# Patient Record
Sex: Male | Born: 1950 | Race: White | Hispanic: No | Marital: Married | Smoking: Never smoker
Health system: Southern US, Community
[De-identification: ages and names within clinical notes are randomized; demographics above are authoritative.]

## PROBLEM LIST (undated history)

## (undated) DIAGNOSIS — K625 Hemorrhage of anus and rectum: Secondary | ICD-10-CM

## (undated) DIAGNOSIS — K648 Other hemorrhoids: Secondary | ICD-10-CM

## (undated) DIAGNOSIS — M1990 Unspecified osteoarthritis, unspecified site: Secondary | ICD-10-CM

## (undated) DIAGNOSIS — T4145XA Adverse effect of unspecified anesthetic, initial encounter: Secondary | ICD-10-CM

## (undated) DIAGNOSIS — R011 Cardiac murmur, unspecified: Secondary | ICD-10-CM

## (undated) DIAGNOSIS — K5900 Constipation, unspecified: Secondary | ICD-10-CM

## (undated) DIAGNOSIS — I10 Essential (primary) hypertension: Secondary | ICD-10-CM

## (undated) DIAGNOSIS — T7840XA Allergy, unspecified, initial encounter: Secondary | ICD-10-CM

## (undated) HISTORY — PX: REFRACTIVE SURGERY: SHX103

## (undated) HISTORY — DX: Other hemorrhoids: K64.8

## (undated) HISTORY — PX: POLYPECTOMY: SHX149

## (undated) HISTORY — PX: UPPER GASTROINTESTINAL ENDOSCOPY: SHX188

## (undated) HISTORY — PX: SHOULDER ARTHROSCOPY W/ ROTATOR CUFF REPAIR: SHX2400

## (undated) HISTORY — PX: SIGMOIDOSCOPY: SUR1295

## (undated) HISTORY — DX: Cardiac murmur, unspecified: R01.1

## (undated) HISTORY — PX: COLONOSCOPY: SHX174

## (undated) HISTORY — PX: COLONOSCOPY W/ POLYPECTOMY: SHX1380

## (undated) HISTORY — DX: Hemorrhage of anus and rectum: K62.5

## (undated) HISTORY — DX: Allergy, unspecified, initial encounter: T78.40XA

## (undated) HISTORY — DX: Constipation, unspecified: K59.00

---

## 1994-05-26 DIAGNOSIS — T8859XA Other complications of anesthesia, initial encounter: Secondary | ICD-10-CM

## 1994-05-26 HISTORY — DX: Other complications of anesthesia, initial encounter: T88.59XA

## 1998-06-18 ENCOUNTER — Emergency Department (HOSPITAL_COMMUNITY): Admission: EM | Admit: 1998-06-18 | Discharge: 1998-06-18 | Payer: Self-pay | Admitting: Emergency Medicine

## 1999-02-27 ENCOUNTER — Encounter: Payer: Self-pay | Admitting: Internal Medicine

## 1999-02-27 ENCOUNTER — Encounter (INDEPENDENT_AMBULATORY_CARE_PROVIDER_SITE_OTHER): Payer: Self-pay | Admitting: *Deleted

## 1999-02-27 ENCOUNTER — Other Ambulatory Visit: Admission: RE | Admit: 1999-02-27 | Discharge: 1999-02-27 | Payer: Self-pay | Admitting: Internal Medicine

## 1999-03-04 ENCOUNTER — Encounter (INDEPENDENT_AMBULATORY_CARE_PROVIDER_SITE_OTHER): Payer: Self-pay | Admitting: *Deleted

## 1999-03-04 ENCOUNTER — Encounter: Payer: Self-pay | Admitting: Internal Medicine

## 1999-03-04 ENCOUNTER — Ambulatory Visit (HOSPITAL_COMMUNITY): Admission: RE | Admit: 1999-03-04 | Discharge: 1999-03-04 | Payer: Self-pay | Admitting: Internal Medicine

## 1999-05-27 HISTORY — PX: EYE SURGERY: SHX253

## 2002-06-15 ENCOUNTER — Encounter: Payer: Self-pay | Admitting: Internal Medicine

## 2006-07-29 ENCOUNTER — Ambulatory Visit: Payer: Self-pay | Admitting: Internal Medicine

## 2006-08-12 ENCOUNTER — Ambulatory Visit: Payer: Self-pay | Admitting: Internal Medicine

## 2010-01-24 ENCOUNTER — Ambulatory Visit (HOSPITAL_BASED_OUTPATIENT_CLINIC_OR_DEPARTMENT_OTHER): Admission: RE | Admit: 2010-01-24 | Discharge: 2010-01-24 | Payer: Self-pay | Admitting: Orthopedic Surgery

## 2010-02-26 ENCOUNTER — Telehealth: Payer: Self-pay | Admitting: Internal Medicine

## 2010-03-05 ENCOUNTER — Ambulatory Visit: Payer: Self-pay | Admitting: Gastroenterology

## 2010-03-05 DIAGNOSIS — K648 Other hemorrhoids: Secondary | ICD-10-CM | POA: Insufficient documentation

## 2010-03-05 DIAGNOSIS — K5900 Constipation, unspecified: Secondary | ICD-10-CM | POA: Insufficient documentation

## 2010-03-05 DIAGNOSIS — K625 Hemorrhage of anus and rectum: Secondary | ICD-10-CM

## 2010-03-05 HISTORY — DX: Other hemorrhoids: K64.8

## 2010-03-05 HISTORY — DX: Constipation, unspecified: K59.00

## 2010-03-05 HISTORY — DX: Hemorrhage of anus and rectum: K62.5

## 2010-06-25 NOTE — Assessment & Plan Note (Signed)
Summary: rectal bleeding/pl                    perry pt   History of Present Illness Visit Type: Initial Visit Primary GI MD: Yancey Flemings MD Primary Kecia Swoboda: Bradd Canary, MD Requesting Mishayla Sliwinski: Bradd Canary, MD Chief Complaint: Upper intermittant dull pains ongoing for years. Pt states starting 3-4 weeks ago pt noticed some BRB in stool. Pt states he has hemorrhoids that will bleed sometimes but usually bleeding subsides after a few days. The bleeding has been ongoing for several weeks. History of Present Illness:   Patient is a 60 year old male seen last in 2008 for colonoscopy for polyps surveillance. He is here for evaluation of painless rectal bleeding with bowel movements. He has had occasional rectal bleeding but lately the bleeding  has been much more frequent.. Blood is bright red, drips in the toilet. Patient does recall having hard stools a few weeks ago while taking pain medication after shoulder surgery. Bowel movements now back to normal.No abdominal pain other than occasional gas pain.  Concerned because father had colon cancer in his 57's. Patient is up to date on screenings.    GI Review of Systems      Denies abdominal pain, acid reflux, belching, bloating, chest pain, dysphagia with liquids, dysphagia with solids, heartburn, loss of appetite, nausea, vomiting, vomiting blood, weight loss, and  weight gain.      Reports constipation and  rectal bleeding.     Denies anal fissure, black tarry stools, change in bowel habit, diarrhea, diverticulosis, fecal incontinence, heme positive stool, hemorrhoids, irritable bowel syndrome, jaundice, light color stool, liver problems, and  rectal pain. Preventive Screening-Counseling & Management  Alcohol-Tobacco     Smoking Status: never      Drug Use:  no.      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  Alcohol Use - yes Daily Caffeine Use Illicit Drug Use - no Smoking Status:  never Drug Use:  no  Vital Signs:  Patient profile:   61 year old male Height:      70 inches Weight:      176.38 pounds BMI:     25.40 Pulse rate:   88 / minute Pulse rhythm:   regular BP sitting:   146 / 88  (right arm) Cuff size:   regular  Vitals Entered By: Christie Nottingham CMA Duncan Dull) (March 05, 2010 8:30 AM)  Physical Exam  General:  Well developed, well nourished, no acute distress. Head:  Normocephalic and atraumatic. Eyes:  Conjunctiva pink, no icterus.  Mouth:  No oral lesions. Tongue moist.  Neck:  no obvious masses  Lungs:  Clear throughout to auscultation. Heart:  Regular rate and rhythm; no murmurs, rubs,  or bruits. Abdomen:  Abdomen soft, nontender, nondistended. No obvious masses or hepatomegaly.Normal bowel sounds.  Rectal:  One mildly inflamed, bluish perianal hemorrhoid. On anoscopy there were large, inflamed internal hemorrhoids Msk:  Symmetrical with no gross deformities. Normal posture. Extremities:  No palmar erythema, no edema.  Neurologic:  Alert and  oriented x4;  grossly normal neurologically. Skin:  Intact without significant lesions  or rashes. Cervical Nodes:  No significant cervical adenopathy. Psych:  Alert and cooperative. Normal mood and affect.    Impression & Recommendations:  Problem # 1:  RECTAL BLEEDING (ICD-569.3) Assessment Deteriorated Likely related to the inflamed internal hemorrhoids seen on anoscopy. Will treat with steroid suppositories. Bowel movements back to baseline but start Miralax for recurrent  constipation. Last full colonoscopy was March 2008 and normal except for internal hemorrhoids.  Will have patient return to see Dr. Marina Goodell in 3-4 weeks for reevaluation. In the interim, patient will call if bleeding doesn't subside.   Problem # 2:  HEMORRHOIDS-INTERNAL (ICD-455.0) Assessment: Deteriorated See #1.  Problem # 3:  FAMILY HX COLON CANCER (ICD-V16.0) Assessment: Comment Only Up to date on screenings, last one March 2008.   Patient Instructions: 1)  We sent prescription for suppositories to [facility]. 2)  Try to keep your stools soft. 3)  You may use a stool softner such as Colace if you need to. 4)  We suggest taking Miralax powder 17 grams in a glass of wataer or clear juiice daily. 5)  High Fiber Diet brochure given. 6)  Appointment made with Dr Marina Goodell for 04-03-10. 7)  Appointment card given. 8)  Copy sent to : Bradd Canary, MD 9)  The medication list was reviewed and reconciled.  All changed / newly prescribed medications were explained.  A complete medication list was provided to the patient / caregiver. Prescriptions: HYDROCORTISONE ACETATE 25 MG SUPP (HYDROCORTISONE ACETATE) Use 1 suppository twice daily for 3 days then at bedtime x 10 days  #15 x 1   Entered by:   Lowry Ram NCMA   Authorized by:   Willette Cluster NP   Signed by:   Lowry Ram NCMA on 03/05/2010   Method used:   Electronically to        [facility] [phone]* (retail)       [address]       [place], [place]  [address]       Ph: [phone] or [phone]       Fax: [phone]   RxID:   [id number]

## 2010-06-25 NOTE — Procedures (Signed)
Summary: colon   Colonoscopy  Procedure date:  06/15/2002  Findings:      Location:  [facility].   Patient Name: Matthew Fleming, Matthew Fleming. MRN:  Procedure Procedures: Colonoscopy CPT: [phone].  Personnel: Endoscopist: Wilhemina Bonito. Marina Goodell, MD.  Exam Location: Exam performed in Outpatient Clinic. Outpatient  Patient Consent: Procedure, Alternatives, Risks and Benefits discussed, consent obtained, from patient. Consent was obtained by the RN.  Indications  Surveillance of: Adenomatous Polyp(s). This is an initial surveillance exam. Initial polypectomy was performed in 2000. in Oct. 1-2 Polyps were found at Index Exam. Largest polyp removed was 1 to 5 mm. Prior polyp located in proximal (splenic flexure and beyond) colon. Pathology of worst  polyp: tubular adenoma.  Increased Risk Screening: For family history of colorectal neoplasia, in  parent age at onset: 67.  History  Pre-Exam Physical: Performed Jun 15, 2002. Entire physical exam was normal.  Exam Exam: Extent of exam reached: Cecum, extent intended: Cecum.  The cecum was identified by appendiceal orifice and IC valve. Patient position: on left side. Colon retroflexion performed. Images taken. ASA Classification: I. Tolerance: excellent.  Monitoring: Pulse and BP monitoring, Oximetry used. Supplemental O2 given.  Colon Prep Used Visicol for colon prep. Prep results: good.  Sedation Meds: Patient assessed and found to be appropriate for moderate (conscious) sedation. Fentanyl 100 mcg. given IV. Versed 10 mg. given IV.  Comments: Some adherent stool cecum/ascending colon resistant to vigorous irrigation Findings NORMAL EXAM: Cecum to Rectum. Comments: internal hemorrhoids present.  DIVERTICULOSIS: Sigmoid Colon. ICD9: Diverticulosis, Colon: 562.10. Comments: mild changes.   Assessment Abnormal examination, see findings above.  Diagnoses: 562.10: Diverticulosis, Colon.  455.0: Hemorrhoids, Internal.    Events  Unplanned Interventions: No intervention was required.  Unplanned Events: There were no complications. Plans Disposition: After procedure patient sent to recovery. After recovery patient sent home.  Scheduling/Referral: Colonoscopy, to Wilhemina Bonito. Marina Goodell, MD, in 3 years,    This report was created from the original endoscopy report, which was reviewed and signed by the above listed endoscopist.

## 2010-06-25 NOTE — Procedures (Signed)
Summary: colon   Colonoscopy  Procedure date:  08/12/2006  Findings:      Location:  [facility].   Patient Name: Matthew Fleming, Matthew Fleming. MRN:  Procedure Procedures: Colonoscopy CPT: [phone].  Personnel: Endoscopist: Wilhemina Bonito. Marina Goodell, MD.  Exam Location: Exam performed in Outpatient Clinic. Outpatient  Patient Consent: Procedure, Alternatives, Risks and Benefits discussed, consent obtained, from patient. Consent was obtained by the RN.  Indications  Surveillance of: Adenomatous Polyp(s). This is not an initial surveillance exam. Initial polypectomy was performed in 2000. Pathology of worst  polyp: tubular adenoma. Previous surveillance exam(s) in  2004,  Increased Risk Screening: For family history of colorectal neoplasia, in  parent age at onset: 57.  History  Current Medications: Patient is not currently taking Coumadin.  Pre-Exam Physical: Performed Aug 12, 2006. Cardio-pulmonary exam, Rectal exam, HEENT exam , Abdominal exam, Mental status exam WNL.  Comments: Pt. history reviewed/updated, physical exam performed prior to initiation of sedation?yes Exam Exam: Extent of exam reached: Cecum, extent intended: Cecum.  The cecum was identified by appendiceal orifice and IC valve. Patient position: on left side. Time to Cecum: 00:05:05. Time for Withdrawl: 00:12:44. Colon retroflexion performed. Images taken. ASA Classification: I. Tolerance: excellent.  Monitoring: Pulse and BP monitoring, Oximetry used. Supplemental O2 given.  Colon Prep Used Miralax for colon prep. Prep results: excellent.  Sedation Meds: Patient assessed and found to be appropriate for moderate (conscious) sedation. Fentanyl 100 mcg. given IV. Versed 10 mg. given IV.  Findings NORMAL EXAM: Cecum to Rectum.   Assessment  Diagnoses: 455.0: Hemorrhoids, Internal.   Comments: NO POLYPS SEEN Events  Unplanned Interventions: No intervention was required.  Unplanned Events: There were  no complications. Plans Disposition: After procedure patient sent to recovery. After recovery patient sent home.  Scheduling/Referral: Colonoscopy, to Wilhemina Bonito. Marina Goodell, MD, in 5 years,    This report was created from the original endoscopy report, which was reviewed and signed by the above listed endoscopist.

## 2010-06-25 NOTE — Procedures (Signed)
Summary: EGD/[facility]  EGD/[facility]   Imported By: Sherian Rein 03/07/2010 09:19:56  _____________________________________________________________________  External Attachment:    Type:   Image     Comment:   External Document

## 2010-06-25 NOTE — Progress Notes (Signed)
Summary: Triage / rectal bleeding  Phone Note Call from Patient Call back at [phone]   Caller: Patient Call For: Dr. Marina Goodell Reason for Call: Talk to Nurse Summary of Call: Rectal bleeding x3 wks Initial call taken by: Karna Christmas,  February 26, 2010 9:58 AM  Follow-up for Phone Call        Spoke with pt. He has been having bright red blood with his BM's  on and off for the last 2- 3 weeks.  No clots, no nausea or vomiting, no fever, but has a dull aching pain above his naval. He states the bleeding does stop past his BM. He states he has had hemorrhoids in the past also. He has not been seen in the office for several years and had his last colonoscopy 08/12/2006. He is out of town this week. I requested chart to Dr Marina Goodell ASAP for review Please advise.Ulis Rias RN  February 26, 2010 10:21 AM     Additional Follow-up for Phone Call Additional follow up Details #1::        work him in with an extender for evaluation when he returns to town. For more serious bleeding or problems with pain in the interim, he should go to the emergency room Additional Follow-up by: Hilarie Fredrickson MD,  February 26, 2010 10:56 AM    Additional Follow-up for Phone Call Additional follow up Details #2::    Called pt and left message to schedule an appt with one of our extenders next week when he is back in town or to go to the ER if bleeding or pain gets worse .Ulis Rias RN  February 26, 2010 11:07 AM  pt returned call and is scheduled for an appt with Gunnar Fusi on 03/05/10.  Chart ordered   Follow-up by: Chales Abrahams CMA Duncan Dull),  February 27, 2010 9:26 AM

## 2010-08-08 LAB — POCT HEMOGLOBIN-HEMACUE: Hemoglobin: 17.3 g/dL — ABNORMAL HIGH (ref 13.0–17.0)

## 2010-11-07 ENCOUNTER — Other Ambulatory Visit: Payer: Self-pay | Admitting: Dermatology

## 2011-06-27 ENCOUNTER — Encounter: Payer: Self-pay | Admitting: Internal Medicine

## 2012-03-25 ENCOUNTER — Encounter: Payer: Self-pay | Admitting: Internal Medicine

## 2012-03-31 ENCOUNTER — Telehealth: Payer: Self-pay | Admitting: Internal Medicine

## 2012-03-31 NOTE — Telephone Encounter (Signed)
Left message for patient to call back  

## 2012-04-02 NOTE — Telephone Encounter (Signed)
Left message for patient to call back  

## 2012-04-07 NOTE — Telephone Encounter (Signed)
Pt called back and wanted to schedule his recall colon. Appts scheduled and pt aware.

## 2012-05-04 ENCOUNTER — Encounter: Payer: Self-pay | Admitting: Internal Medicine

## 2012-05-04 ENCOUNTER — Ambulatory Visit (AMBULATORY_SURGERY_CENTER): Payer: BC Managed Care – HMO | Admitting: *Deleted

## 2012-05-04 VITALS — Ht 70.0 in | Wt 173.0 lb

## 2012-05-04 DIAGNOSIS — Z1211 Encounter for screening for malignant neoplasm of colon: Secondary | ICD-10-CM

## 2012-05-11 ENCOUNTER — Encounter: Payer: BC Managed Care – HMO | Admitting: Internal Medicine

## 2012-06-25 ENCOUNTER — Telehealth: Payer: Self-pay | Admitting: Internal Medicine

## 2012-06-25 NOTE — Telephone Encounter (Signed)
Colonoscopy from 12/19 was cancelled by pt.  Rescheduled for 3/5.  Pt scheduled for PV for 2/19.  Pt aware of appointment.

## 2012-07-14 ENCOUNTER — Ambulatory Visit (AMBULATORY_SURGERY_CENTER): Payer: BC Managed Care – PPO | Admitting: *Deleted

## 2012-07-14 ENCOUNTER — Encounter: Payer: Self-pay | Admitting: Internal Medicine

## 2012-07-14 VITALS — Ht 70.0 in | Wt 174.4 lb

## 2012-07-14 MED ORDER — MOVIPREP 100 G PO SOLR: ORAL | Status: DC

## 2012-07-28 ENCOUNTER — Ambulatory Visit (AMBULATORY_SURGERY_CENTER): Payer: BC Managed Care – PPO | Admitting: Internal Medicine

## 2012-07-28 ENCOUNTER — Encounter: Payer: Self-pay | Admitting: Internal Medicine

## 2012-07-28 VITALS — BP 112/75 | HR 45 | Temp 97.0°F | Resp 10 | Ht 70.0 in | Wt 174.0 lb

## 2012-07-28 DIAGNOSIS — Z800 Family history of malignant neoplasm of digestive organs: Secondary | ICD-10-CM

## 2012-07-28 DIAGNOSIS — Z860100 Personal history of colon polyps, unspecified: Secondary | ICD-10-CM

## 2012-07-28 DIAGNOSIS — D126 Benign neoplasm of colon, unspecified: Secondary | ICD-10-CM

## 2012-07-28 MED ORDER — SODIUM CHLORIDE 0.9 % IV SOLN: 500.0000 mL | INTRAVENOUS | Status: DC

## 2012-07-28 NOTE — Progress Notes (Signed)
YOU HAD AN ENDOSCOPIC PROCEDURE TODAY AT THE [facility]: Refer to the procedure report that was given to you for any specific questions about what was found during the examination.  If the procedure report does not answer your questions, please call your gastroenterologist to clarify.  If you requested that your care partner not be given the details of your procedure findings, then the procedure report has been included in a sealed envelope for you to review at your convenience later.  YOU SHOULD EXPECT: Some feelings of bloating in the abdomen. Passage of more gas than usual.  Walking can help get rid of the air that was put into your GI tract during the procedure and reduce the bloating. If you had a lower endoscopy (such as a colonoscopy or flexible sigmoidoscopy) you may notice spotting of blood in your stool or on the toilet paper. If you underwent a bowel prep for your procedure, then you may not have a normal bowel movement for a few days.  DIET: Your first meal following the procedure should be a light meal and then it is ok to progress to your normal diet.  A half-sandwich or bowl of soup is an example of a good first meal.  Heavy or fried foods are harder to digest and may make you feel nauseous or bloated.  Likewise meals heavy in dairy and vegetables can cause extra gas to form and this can also increase the bloating.  Drink plenty of fluids but you should avoid alcoholic beverages for 24 hours.  ACTIVITY: Your care partner should take you home directly after the procedure.  You should plan to take it easy, moving slowly for the rest of the day.  You can resume normal activity the day after the procedure however you should NOT DRIVE or use heavy machinery for 24 hours (because of the sedation medicines used during the test).    SYMPTOMS TO REPORT IMMEDIATELY: A gastroenterologist can be reached at any hour.  During normal business hours, 8:30 AM to 5:00 PM Monday through Friday,  call [phone].  After hours and on weekends, please call the GI answering service at [phone] who will take a message and have the physician on call contact you.   Following lower endoscopy (colonoscopy or flexible sigmoidoscopy):  Excessive amounts of blood in the stool  Significant tenderness or worsening of abdominal pains  Swelling of the abdomen that is new, acute  Fever of 100F or higher  FOLLOW UP: If any biopsies were taken you will be contacted by phone or by letter within the next 1-3 weeks.  Call your gastroenterologist if you have not heard about the biopsies in 3 weeks.  Our staff will call the home number listed on your records the next business day following your procedure to check on you and address any questions or concerns that you may have at that time regarding the information given to you following your procedure. This is a courtesy call and so if there is no answer at the home number and we have not heard from you through the emergency physician on call, we will assume that you have returned to your regular daily activities without incident.  SIGNATURES/CONFIDENTIALITY: You and/or your care partner have signed paperwork which will be entered into your electronic medical record.  These signatures attest to the fact that that the information above on your After Visit Summary has been reviewed and is understood.  Full responsibility of the confidentiality of this   discharge information lies with you and/or your care-partner.    Resume medications. Information given on polyps with discharge instructions. 

## 2012-07-28 NOTE — Patient Instructions (Addendum)
YOU HAD AN ENDOSCOPIC PROCEDURE TODAY AT THE [facility]: Refer to the procedure report that was given to you for any specific questions about what was found during the examination.  If the procedure report does not answer your questions, please call your gastroenterologist to clarify.  If you requested that your care partner not be given the details of your procedure findings, then the procedure report has been included in a sealed envelope for you to review at your convenience later.  YOU SHOULD EXPECT: Some feelings of bloating in the abdomen. Passage of more gas than usual.  Walking can help get rid of the air that was put into your GI tract during the procedure and reduce the bloating. If you had a lower endoscopy (such as a colonoscopy or flexible sigmoidoscopy) you may notice spotting of blood in your stool or on the toilet paper. If you underwent a bowel prep for your procedure, then you may not have a normal bowel movement for a few days.  DIET: Your first meal following the procedure should be a light meal and then it is ok to progress to your normal diet.  A half-sandwich or bowl of soup is an example of a good first meal.  Heavy or fried foods are harder to digest and may make you feel nauseous or bloated.  Likewise meals heavy in dairy and vegetables can cause extra gas to form and this can also increase the bloating.  Drink plenty of fluids but you should avoid alcoholic beverages for 24 hours.  ACTIVITY: Your care partner should take you home directly after the procedure.  You should plan to take it easy, moving slowly for the rest of the day.  You can resume normal activity the day after the procedure however you should NOT DRIVE or use heavy machinery for 24 hours (because of the sedation medicines used during the test).    SYMPTOMS TO REPORT IMMEDIATELY: A gastroenterologist can be reached at any hour.  During normal business hours, 8:30 AM to 5:00 PM Monday through Friday,  call [phone].  After hours and on weekends, please call the GI answering service at [phone] who will take a message and have the physician on call contact you.   Following lower endoscopy (colonoscopy or flexible sigmoidoscopy):  Excessive amounts of blood in the stool  Significant tenderness or worsening of abdominal pains  Swelling of the abdomen that is new, acute  Fever of 100F or higher  FOLLOW UP: If any biopsies were taken you will be contacted by phone or by letter within the next 1-3 weeks.  Call your gastroenterologist if you have not heard about the biopsies in 3 weeks.  Our staff will call the home number listed on your records the next business day following your procedure to check on you and address any questions or concerns that you may have at that time regarding the information given to you following your procedure. This is a courtesy call and so if there is no answer at the home number and we have not heard from you through the emergency physician on call, we will assume that you have returned to your regular daily activities without incident.  SIGNATURES/CONFIDENTIALITY: You and/or your care partner have signed paperwork which will be entered into your electronic medical record.  These signatures attest to the fact that that the information above on your After Visit Summary has been reviewed and is understood.  Full responsibility of the confidentiality of this   discharge information lies with you and/or your care-partner.    Resume medications. Information given on polyps with discharge instructions. 

## 2012-07-28 NOTE — Progress Notes (Signed)
Patient did not experience any of the following events: a burn prior to discharge; a fall within the facility; wrong site/side/patient/procedure/implant event; or a hospital transfer or hospital admission upon discharge from the facility. (G8907) Patient did not have preoperative order for IV antibiotic SSI prophylaxis. ([id number])  

## 2012-07-28 NOTE — Progress Notes (Signed)
Called to room to assist during endoscopic procedure.  Patient ID and intended procedure confirmed with present staff. Received instructions for my participation in the procedure from the performing physician. ewm 

## 2012-07-28 NOTE — Op Note (Signed)
[facility] [address] [place], [address]   COLONOSCOPY PROCEDURE REPORT  PATIENT: Matthew Fleming, Matthew Fleming.  MR#: [id number] BIRTHDATE: 04-17-1951 , 61  yrs. old GENDER: Male ENDOSCOPIST: Roxy Cedar, MD REFERRED HQ:[id number] Program Recall PROCEDURE DATE:  07/28/2012 PROCEDURE:   Colonoscopy with snare polypectomy    x 1 ASA CLASS:   Class I INDICATIONS:Patient's immediate family history of colon cancer (parent 69) and Patient's personal history of adenomatous colon polyps (2000, 04, 08) MEDICATIONS: MAC sedation, administered by CRNA and propofol (Diprivan) 300mg  IV  DESCRIPTION OF PROCEDURE:   After the risks benefits and alternatives of the procedure were thoroughly explained, informed consent was obtained.  A digital rectal exam revealed no abnormalities of the rectum.   The LB [id number]  endoscope was introduced through the anus and advanced to the cecum, which was identified by both the appendix and ileocecal valve. No adverse events experienced.   The quality of the prep was good, using MoviPrep  The instrument was then slowly withdrawn as the colon was fully examined.      COLON FINDINGS: A diminutive polyp was found at the cecum.  A polypectomy was performed with a cold snare.  The resection was complete and the polyp tissue was completely retrieved.   The colon mucosa was otherwise normal.  Retroflexed views revealed internal hemorrhoids. The time to cecum=3 minutes 59 seconds.  Withdrawal time=12 minutes 27 seconds.  The scope was withdrawn and the procedure completed. COMPLICATIONS: There were no complications.  ENDOSCOPIC IMPRESSION: 1.   Diminutive polyp was found at the cecum; polypectomy was performed with a cold snare 2.   The colon mucosa was otherwise normal  RECOMMENDATIONS: 1. Follow up colonoscopy in 5 years   eSigned:  Roxy Cedar, MD 07/28/2012 10:28 AM   cc: Bradd Canary, MD and The Patient   PATIENT  NAME:  Matthew Fleming. MR#: [id number]

## 2012-07-29 ENCOUNTER — Telehealth: Payer: Self-pay | Admitting: *Deleted

## 2012-07-29 NOTE — Telephone Encounter (Signed)
  Follow up Call-  Call back number 07/28/2012  Post procedure Call Back phone  # [phone]  Permission to leave phone message Yes     Patient questions:  Do you have a fever, pain , or abdominal swelling? no Pain Score  0 *  Have you tolerated food without any problems? yes  Have you been able to return to your normal activities? yes  Do you have any questions about your discharge instructions: Diet   no Medications  no Follow up visit  no  Do you have questions or concerns about your Care? no  Actions: * If pain score is 4 or above: No action needed, pain <4.

## 2012-08-02 ENCOUNTER — Encounter: Payer: Self-pay | Admitting: Internal Medicine

## 2014-07-17 ENCOUNTER — Encounter: Payer: Self-pay | Admitting: Podiatry

## 2014-07-17 ENCOUNTER — Ambulatory Visit (INDEPENDENT_AMBULATORY_CARE_PROVIDER_SITE_OTHER): Payer: BLUE CROSS/BLUE SHIELD | Admitting: Podiatry

## 2014-07-17 ENCOUNTER — Ambulatory Visit (INDEPENDENT_AMBULATORY_CARE_PROVIDER_SITE_OTHER): Payer: BLUE CROSS/BLUE SHIELD

## 2014-07-17 VITALS — BP 132/74 | HR 59 | Resp 16

## 2014-07-17 DIAGNOSIS — M79672 Pain in left foot: Secondary | ICD-10-CM

## 2014-07-17 DIAGNOSIS — M779 Enthesopathy, unspecified: Secondary | ICD-10-CM | POA: Diagnosis not present

## 2014-07-17 MED ORDER — TRIAMCINOLONE ACETONIDE 10 MG/ML IJ SUSP
10.0000 mg | Freq: Once | INTRAMUSCULAR | Status: AC
  Administered 2014-07-17: 10 mg

## 2014-07-17 NOTE — Progress Notes (Signed)
   Subjective:    Patient ID: Matthew Fleming, male    DOB: [birth year], 64 y.o.   MRN: [id number]  HPI Comments: "I have pain in the left foot"  Patient c/o aching dorsal midfoot for about 1 month. He has been treated here for fasciitis. Was made orthotics but could never wear. He's had multiple cortisone injections in the past. No injury.     Review of Systems  All other systems reviewed and are negative.      Objective:   Physical Exam        Assessment & Plan:

## 2014-07-17 NOTE — Progress Notes (Signed)
Subjective:     Patient ID: Matthew Fleming, male   DOB: 05-Nov-1950, 64 y.o.   MRN: [id number]  HPI patient presents with pain on top of his left foot also has structural bunion deformities and flatfoot deformity noted bilateral. States it's been present for several months worse over the last month   Review of Systems  All other systems reviewed and are negative.      Objective:   Physical Exam  Constitutional: He is oriented to person, place, and time.  Cardiovascular: Intact distal pulses.   Musculoskeletal: Normal range of motion.  Neurological: He is oriented to person, place, and time.  Skin: Skin is warm.  Nursing note and vitals reviewed.  neurovascular found to be intact with muscle strength adequate and range of motion subtalar and midtarsal joint within normal limits. 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 Today I did a careful dorsal injection of the midtarsal joint extensor complex 3 mg Kenalog 5 mg Xylocaine and scanned for custom orthotics to reduce the stress on his feet and help to elevate his arch. He will be seen back in the next 3 weeks or earlier if any issues should occur

## 2014-08-09 ENCOUNTER — Ambulatory Visit: Payer: BLUE CROSS/BLUE SHIELD | Admitting: *Deleted

## 2014-08-09 DIAGNOSIS — M779 Enthesopathy, unspecified: Secondary | ICD-10-CM

## 2014-08-09 NOTE — Patient Instructions (Signed)

## 2014-08-09 NOTE — Progress Notes (Signed)
Patient ID: Matthew Fleming, male   DOB: 09-21-1950, 64 y.o.   MRN: [id number] PICKING UP INSERTS

## 2014-08-11 LAB — BASIC METABOLIC PANEL
BUN: 14 (ref 4–21)
Creatinine: 1.1 (ref 0.6–1.3)
Glucose: 98
POTASSIUM: 4.4 (ref 3.4–5.3)
Sodium: 141 (ref 137–147)

## 2014-08-11 LAB — HEPATIC FUNCTION PANEL
ALT: 11 (ref 10–40)
AST: 14 (ref 14–40)

## 2014-08-11 LAB — LIPID PANEL
CHOLESTEROL: 178 (ref 0–200)
HDL: 66 (ref 35–70)
LDL Cholesterol: 100
Triglycerides: 61 (ref 40–160)

## 2014-08-11 LAB — CBC AND DIFFERENTIAL
HCT: 46 (ref 41–53)
Hemoglobin: 15.1 (ref 13.5–17.5)
WBC: 5.2

## 2014-08-11 LAB — TSH: TSH: 1.57 (ref 0.41–5.90)

## 2014-09-21 ENCOUNTER — Ambulatory Visit: Payer: BLUE CROSS/BLUE SHIELD | Admitting: Podiatry

## 2014-09-28 ENCOUNTER — Ambulatory Visit (INDEPENDENT_AMBULATORY_CARE_PROVIDER_SITE_OTHER): Payer: BLUE CROSS/BLUE SHIELD | Admitting: Podiatry

## 2014-09-28 ENCOUNTER — Encounter: Payer: Self-pay | Admitting: Podiatry

## 2014-09-28 VITALS — BP 132/60 | HR 62 | Resp 12

## 2014-09-28 DIAGNOSIS — M779 Enthesopathy, unspecified: Secondary | ICD-10-CM | POA: Diagnosis not present

## 2014-09-28 DIAGNOSIS — M2012 Hallux valgus (acquired), left foot: Secondary | ICD-10-CM | POA: Diagnosis not present

## 2014-09-28 DIAGNOSIS — M21612 Bunion of left foot: Secondary | ICD-10-CM

## 2014-09-28 NOTE — Progress Notes (Signed)
Subjective:     Patient ID: Matthew Fleming, male   DOB: 1950/12/06, 64 y.o.   MRN: [id number]  HPI patient is not happy about his orthotics and also has questions about his left bunion   Review of Systems     Objective:   Physical Exam Neurovascular status intact with orthotics which appear to be too hard for him which we will modify and structural hyperostosis medial aspect first metatarsal head left    Assessment:     Structural bunion deformity left with orthotics which need to be worked on    Plan:     Discussed correction of the bunion which we'll do the future and at this time we will send his orthotics in for revamping

## 2014-10-27 ENCOUNTER — Ambulatory Visit: Payer: BLUE CROSS/BLUE SHIELD | Admitting: *Deleted

## 2014-10-27 DIAGNOSIS — M779 Enthesopathy, unspecified: Secondary | ICD-10-CM

## 2014-10-27 NOTE — Progress Notes (Signed)
Patient ID: Matthew Fleming, male   DOB: [birth year], 64 y.o.   MRN: [id number] Picking up inserts

## 2014-10-27 NOTE — Patient Instructions (Signed)

## 2014-12-28 ENCOUNTER — Encounter: Payer: Self-pay | Admitting: Internal Medicine

## 2015-01-02 ENCOUNTER — Telehealth: Payer: Self-pay | Admitting: *Deleted

## 2015-01-02 NOTE — Telephone Encounter (Addendum)
PATIENT CAME INTO THE [facility] OFFICE AND STATED THAT HE WANTED THE AMOUNT TAKEN OFF HIS BILL FOR THE INSERTS AND THAT IS WHAT DR REGAL TOLD HIM AND I ASKED HIM IF HE WOULD LIKE TO MAKE AN APPOINTMENT WITH DR REGAL AND HE STATED NO AND THAT THE INSERTS HAVE NOT WORKED AND JUST WANTED THE AMOUNT TAKEN OFF. Matthew Fleming

## 2015-06-26 ENCOUNTER — Encounter: Payer: Self-pay | Admitting: Cardiovascular Disease

## 2015-06-26 ENCOUNTER — Telehealth: Payer: Self-pay

## 2015-06-26 ENCOUNTER — Ambulatory Visit (INDEPENDENT_AMBULATORY_CARE_PROVIDER_SITE_OTHER): Payer: BLUE CROSS/BLUE SHIELD | Admitting: Cardiovascular Disease

## 2015-06-26 VITALS — BP 150/82 | HR 56 | Ht 70.0 in | Wt 179.0 lb

## 2015-06-26 DIAGNOSIS — Z7189 Other specified counseling: Secondary | ICD-10-CM

## 2015-06-26 DIAGNOSIS — Z7689 Persons encountering health services in other specified circumstances: Secondary | ICD-10-CM

## 2015-06-26 NOTE — Patient Instructions (Addendum)
Medication Instructions:  Your physician recommends that you continue on your current medications as directed. Please refer to the Current Medication list given to you today.  Labwork: NONE  Testing/Procedures: Cardiac CT scanning, (CAT scanning), is a noninvasive, special x-Faison that produces cross-sectional images of the body using x-rays and a computer. CT scans help physicians diagnose and treat medical conditions. For some CT exams, a contrast material is used to enhance visibility in the area of the body being studied. CT scans provide greater clarity and reveal more details than regular x-[place] exams.  Follow-Up: Your physician wants you to follow-up as needed.   Call CT department at [phone] to schedule appointment.  Here is Dr. Jake Church number [phone] if needed.  If you need a refill on your cardiac medications before your next appointment, please call your pharmacy.

## 2015-06-26 NOTE — Progress Notes (Signed)
Patient ID: Matthew Fleming, male   DOB: [birth year], 65 y.o.   MRN: [id number]     Cardiology Office Note   Date:  06/26/2015   ID:  Brittney, Matthew Fleming Nov 01, 1950, MRN [id number]  PCP:  Pauline Good, MD  Cardiologist:   Jenkins Rouge, MD   Chief Complaint  Patient presents with  . Establish Care    no sx      History of Present Illness: Matthew Fleming is a 65 y.o. male who presents for heart evaluation. Self referred Wanted ECG.  CRF;s only family history Had a physical for life insurance in the Fall and ECG read as abnormal with "biphasic T wave" I reviewed this ECG and felt it was normal.  Activity limited by left hip pain worse since March. Had accident on horse in 20's and has had "arthritis" in hip.  Acitve less running since hip acted up but uses bike, elliptical and weights 4x/week with no chest pain , palpitations dyspnea or syncope.  No other older ECGls in Matthew Fleming.  No history of CAD, vascular disease.  Non smoker  Currently retired from [employer]    Past Medical History  Diagnosis Date  . HEMORRHOIDS-INTERNAL 03/05/2010    Qualifier: Diagnosis of  By: Chester Holstein NP, Nevin Bloodgood    . CONSTIPATION 03/05/2010    Qualifier: Diagnosis of  By: Chester Holstein NP, Nevin Bloodgood    . RECTAL BLEEDING 03/05/2010    Qualifier: Diagnosis of  By: Chester Holstein NP, Nevin Bloodgood      Past Surgical History  Procedure Laterality Date  . Rotator cuff repair  1996 , 2012    bilateral     No current outpatient prescriptions on file.   No current facility-administered medications for this visit.    Allergies:   Review of patient's allergies indicates no known allergies.    Social History:  The patient  reports that he has never smoked. He has never used smokeless tobacco. He reports that he does not drink alcohol or use illicit drugs.   Family History:  The patient's family history includes Colon cancer (age of onset: 24) in his father; Heart attack in his father; Heart disease in his father; Hypertension in his  mother; Pancreatic cancer in his mother.    ROS:  Please see the history of present illness.   Otherwise, review of systems are positive for none.   All other systems are reviewed and negative.    PHYSICAL EXAM: VS:  BP 150/82 mmHg  Pulse 56  Ht 5\' 10"  (1.778 m)  Wt 81.194 kg (179 lb)  BMI 25.68 kg/m2  SpO2 96% , BMI Body mass index is 25.68 kg/(m^2). Affect appropriate Healthy:  appears stated age 20: normal Neck supple with no adenopathy JVP normal no bruits no thyromegaly Lungs clear with no wheezing and good diaphragmatic motion Heart:  S1/S2 no murmur, no rub, gallop or click PMI normal Abdomen: benighn, BS positve, no tenderness, no AAA no bruit.  No HSM or HJR Distal pulses intact with no bruits No edema Neuro non-focal Skin warm and dry No muscular weakness    EKG:  Fall 2016 NSR normal ECG 06/26/15  SR rate 56 normal    Recent Labs: No results found for requested labs within last 365 days.    Lipid Panel No results found for: CHOL, TRIG, HDL, CHOLHDL, VLDL, LDLCALC, LDLDIRECT    Wt Readings from Last 3 Encounters:  06/26/15 81.194 kg (179 lb)  07/28/12 78.926 kg (174 lb)  07/14/12 79.107 kg (  174 lb 6.4 oz)      Other studies Reviewed: Additional studies/ records that were reviewed today include: ECG form Fall .    ASSESSMENT AND PLAN:  1.  ECG:  Both ECG from Fall and today are normal No reason why insurance rates should be adjusted for heart issues.   2. Hip Arthritis:  NSAI's  Gave him Gerald Stabs Blackmon's name in case he needs   Insurance rates should be normal and not adjusted for any heart issues    Current medicines are reviewed at length with the patient today.  The patient does not have concerns regarding medicines.  The following changes have been made:  no change  Labs/ tests ordered today include: None  No orders of the defined types were placed in this encounter.     Disposition:   FU with me PRN     Signed, Jenkins Rouge,  MD  06/26/2015 10:25 AM    [facility] [place], [place], [place]  [address] Phone: [phone]; Fax: [phone]

## 2015-06-26 NOTE — Telephone Encounter (Signed)
Error

## 2015-06-27 ENCOUNTER — Ambulatory Visit (INDEPENDENT_AMBULATORY_CARE_PROVIDER_SITE_OTHER)
Admission: RE | Admit: 2015-06-27 | Discharge: 2015-06-27 | Disposition: A | Discharge status: Home / Self Care | Payer: BLUE CROSS/BLUE SHIELD | Source: Ambulatory Visit | Attending: Cardiovascular Disease | Admitting: Cardiovascular Disease

## 2015-06-27 DIAGNOSIS — Z7689 Persons encountering health services in other specified circumstances: Secondary | ICD-10-CM

## 2015-06-27 DIAGNOSIS — Z7189 Other specified counseling: Secondary | ICD-10-CM

## 2015-07-23 ENCOUNTER — Other Ambulatory Visit: Payer: Self-pay | Admitting: Orthopaedic Surgery

## 2015-07-24 ENCOUNTER — Encounter (HOSPITAL_COMMUNITY)
Admission: RE | Admit: 2015-07-24 | Discharge: 2015-07-24 | Disposition: A | Discharge status: Home / Self Care | Payer: BLUE CROSS/BLUE SHIELD | Source: Ambulatory Visit | Attending: Orthopaedic Surgery | Admitting: Orthopaedic Surgery

## 2015-07-24 ENCOUNTER — Encounter (HOSPITAL_COMMUNITY): Payer: Self-pay

## 2015-07-24 DIAGNOSIS — M1612 Unilateral primary osteoarthritis, left hip: Secondary | ICD-10-CM | POA: Insufficient documentation

## 2015-07-24 DIAGNOSIS — Z01812 Encounter for preprocedural laboratory examination: Secondary | ICD-10-CM | POA: Insufficient documentation

## 2015-07-24 HISTORY — DX: Adverse effect of unspecified anesthetic, initial encounter: T41.45XA

## 2015-07-24 HISTORY — DX: Unspecified osteoarthritis, unspecified site: M19.90

## 2015-07-24 LAB — CBC
HEMATOCRIT: 44.5 % (ref 39.0–52.0)
Hemoglobin: 15.3 g/dL (ref 13.0–17.0)
MCH: 32.1 pg (ref 26.0–34.0)
MCHC: 34.4 g/dL (ref 30.0–36.0)
MCV: 93.3 fL (ref 78.0–100.0)
PLATELETS: 201 10*3/uL (ref 150–400)
RBC: 4.77 MIL/uL (ref 4.22–5.81)
RDW: 12.1 % (ref 11.5–15.5)
WBC: 6.8 10*3/uL (ref 4.0–10.5)

## 2015-07-24 LAB — SURGICAL PCR SCREEN
MRSA, PCR: NEGATIVE
Staphylococcus aureus: NEGATIVE

## 2015-07-24 LAB — BASIC METABOLIC PANEL
Anion gap: 11 (ref 5–15)
BUN: 15 mg/dL (ref 6–20)
CHLORIDE: 105 mmol/L (ref 101–111)
CO2: 27 mmol/L (ref 22–32)
CREATININE: 1.03 mg/dL (ref 0.61–1.24)
Calcium: 9.4 mg/dL (ref 8.9–10.3)
GFR calc Af Amer: >60 mL/min (ref >60)
GLUCOSE: 68 mg/dL (ref 65–99)
POTASSIUM: 4.5 mmol/L (ref 3.5–5.1)
SODIUM: 143 mmol/L (ref 135–145)

## 2015-07-24 NOTE — Progress Notes (Signed)
I spoke with Apolonio Schneiders, patients nurse and updated assessment and gave instructions for 07/25/15.  I asked if they had been instructed on Lovenox- hold or not.  Apolonio Schneiders said that they had not been instructed to her knowledge.  I called Sherri at Dr Phoebe Sharps office, Sherri reported that Dr Erlinda Hong said to stop it now,.  Dr Erlinda Hong was informed that patient had today's dose already.  I called Apolonio Schneiders at [facility] and instructed her to stop Lovenox as of now per Dr Phoebe Sharps order.

## 2015-07-24 NOTE — Pre-Procedure Instructions (Addendum)
    Sapan Hoit Kinnear  07/24/2015     Your procedure is scheduled on :  Tuesday, March 14.  Report to [facility] Admitting at 5:30 A.M.                  Your surgery or procedure is scheduled for 7:30 AM   Call this number if you have problems the morning of surgery:[phone]                  For any other questions, please call [phone], Monday - Friday 8 AM - 4 PM.   Remember:  Do not eat food or drink liquids after midnight Monday, March 13.  Take these medicines the morning of surgery with A SIP OF WATER : None                   MArch 1- Stop taking Goody Powders and Ibuprofen.   Do not wear jewelry, make-up or nail polish.  Do not wear lotions, powders, or perfumes.               Men may shave face and neck.  Do not bring valuables to the hospital.  [facility] is not responsible for any belongings or valuables.  Contacts, dentures or bridgework may not be worn into surgery.  Leave your suitcase in the car.  After surgery it may be brought to your room.  For patients admitted to the hospital, discharge time will be determined by your treatment team.  Special instructions: Review  [facility] - Preparing For Surgery.  Please read over the following fact sheets that you were given. Pain Booklet, Coughing and Deep Breathing, MRSA Information and Surgical Site Infection Prevention

## 2015-08-06 MED ORDER — CEFAZOLIN SODIUM-DEXTROSE 2-3 GM-% IV SOLR
2.0000 g | INTRAVENOUS | Status: AC
  Administered 2015-08-07: 2 g via INTRAVENOUS
  Filled 2015-08-06: qty 50

## 2015-08-06 MED ORDER — TRANEXAMIC ACID 1000 MG/10ML IV SOLN
1000.0000 mg | INTRAVENOUS | Status: AC
  Administered 2015-08-07: 1000 mg via INTRAVENOUS
  Filled 2015-08-06: qty 10

## 2015-08-06 NOTE — Anesthesia Preprocedure Evaluation (Addendum)
Anesthesia Evaluation  Patient identified by MRN, date of birth, ID band Patient awake    Reviewed: Allergy & Precautions, NPO status , Patient's Chart, lab work & pertinent test results  History of Anesthesia Complications Negative for: history of anesthetic complications  Airway Mallampati: II  TM Distance: >3 FB Neck ROM: Full    Dental no notable dental hx. (+) Teeth Intact   Pulmonary neg pulmonary ROS,    Pulmonary exam normal breath sounds clear to auscultation       Cardiovascular negative cardio ROS Normal cardiovascular exam Rhythm:Regular Rate:Normal     Neuro/Psych negative neurological ROS  negative psych ROS   GI/Hepatic negative GI ROS, Neg liver ROS,   Endo/Other  negative endocrine ROS  Renal/GU negative Renal ROS     Musculoskeletal  (+) Arthritis , Osteoarthritis,    Abdominal   Peds  Hematology negative hematology ROS (+)   Anesthesia Other Findings   Reproductive/Obstetrics negative OB ROS                            Anesthesia Physical Anesthesia Plan  ASA: II  Anesthesia Plan: Spinal   Post-op Pain Management:    Induction: Intravenous  Airway Management Planned:   Additional Equipment:   Intra-op Plan:   Post-operative Plan:   Informed Consent: I have reviewed the patients History and Physical, chart, labs and discussed the procedure including the risks, benefits and alternatives for the proposed anesthesia with the patient or authorized representative who has indicated his/her understanding and acceptance.   Dental advisory given  Plan Discussed with: CRNA  Anesthesia Plan Comments:         Anesthesia Quick Evaluation

## 2015-08-07 ENCOUNTER — Inpatient Hospital Stay (HOSPITAL_COMMUNITY): Payer: BLUE CROSS/BLUE SHIELD

## 2015-08-07 ENCOUNTER — Inpatient Hospital Stay (HOSPITAL_COMMUNITY): Payer: BLUE CROSS/BLUE SHIELD | Admitting: Anesthesiology

## 2015-08-07 ENCOUNTER — Encounter (HOSPITAL_COMMUNITY)
Admission: RE | Disposition: A | Discharge status: Home Health | Payer: Self-pay | Source: Ambulatory Visit | Attending: Orthopaedic Surgery

## 2015-08-07 ENCOUNTER — Encounter (HOSPITAL_COMMUNITY): Payer: Self-pay | Admitting: *Deleted

## 2015-08-07 ENCOUNTER — Inpatient Hospital Stay (HOSPITAL_COMMUNITY)
Admission: RE | Admit: 2015-08-07 | Discharge: 2015-08-08 | DRG: 470 | Disposition: A | Discharge status: Home Health | Payer: BLUE CROSS/BLUE SHIELD | Source: Ambulatory Visit | Attending: Orthopaedic Surgery | Admitting: Orthopaedic Surgery

## 2015-08-07 DIAGNOSIS — M25552 Pain in left hip: Secondary | ICD-10-CM | POA: Diagnosis present

## 2015-08-07 DIAGNOSIS — Z800 Family history of malignant neoplasm of digestive organs: Secondary | ICD-10-CM | POA: Diagnosis not present

## 2015-08-07 DIAGNOSIS — M1612 Unilateral primary osteoarthritis, left hip: Secondary | ICD-10-CM | POA: Diagnosis present

## 2015-08-07 DIAGNOSIS — Z8249 Family history of ischemic heart disease and other diseases of the circulatory system: Secondary | ICD-10-CM

## 2015-08-07 DIAGNOSIS — Z96642 Presence of left artificial hip joint: Secondary | ICD-10-CM

## 2015-08-07 DIAGNOSIS — Z419 Encounter for procedure for purposes other than remedying health state, unspecified: Secondary | ICD-10-CM

## 2015-08-07 HISTORY — PX: TOTAL HIP ARTHROPLASTY: SHX124

## 2015-08-07 SURGERY — ARTHROPLASTY, HIP, TOTAL, ANTERIOR APPROACH
Anesthesia: Spinal | Site: Hip | Laterality: Left

## 2015-08-07 MED ORDER — PHENOL 1.4 % MT LIQD: 1.0000 | OROMUCOSAL | Status: DC | PRN

## 2015-08-07 MED ORDER — ONDANSETRON HCL 4 MG/2ML IJ SOLN: 4.0000 mg | Freq: Four times a day (QID) | INTRAMUSCULAR | Status: DC | PRN

## 2015-08-07 MED ORDER — MIDAZOLAM HCL 5 MG/5ML IJ SOLN
INTRAMUSCULAR | Status: DC | PRN
  Administered 2015-08-07 (×4): 1 mg via INTRAVENOUS

## 2015-08-07 MED ORDER — HYDROMORPHONE HCL 1 MG/ML IJ SOLN
1.0000 mg | INTRAMUSCULAR | Status: DC | PRN
  Administered 2015-08-07: 1 mg via INTRAVENOUS
  Filled 2015-08-07: qty 1

## 2015-08-07 MED ORDER — MEPERIDINE HCL 25 MG/ML IJ SOLN: 6.2500 mg | INTRAMUSCULAR | Status: DC | PRN

## 2015-08-07 MED ORDER — KETOROLAC TROMETHAMINE 15 MG/ML IJ SOLN
7.5000 mg | Freq: Four times a day (QID) | INTRAMUSCULAR | Status: AC
  Administered 2015-08-07 – 2015-08-08 (×3): 7.5 mg via INTRAVENOUS
  Filled 2015-08-07 (×2): qty 1

## 2015-08-07 MED ORDER — ALUM & MAG HYDROXIDE-SIMETH 200-200-20 MG/5ML PO SUSP: 30.0000 mL | ORAL | Status: DC | PRN

## 2015-08-07 MED ORDER — ACETAMINOPHEN 325 MG PO TABS
650.0000 mg | ORAL_TABLET | Freq: Four times a day (QID) | ORAL | Status: DC | PRN
  Administered 2015-08-08: 650 mg via ORAL
  Filled 2015-08-07: qty 2

## 2015-08-07 MED ORDER — METHOCARBAMOL 1000 MG/10ML IJ SOLN
500.0000 mg | Freq: Four times a day (QID) | INTRAVENOUS | Status: DC | PRN
  Filled 2015-08-07: qty 5

## 2015-08-07 MED ORDER — HYDROMORPHONE HCL 1 MG/ML IJ SOLN
INTRAMUSCULAR | Status: AC
  Filled 2015-08-07: qty 1

## 2015-08-07 MED ORDER — CEFAZOLIN SODIUM 1-5 GM-% IV SOLN
1.0000 g | Freq: Four times a day (QID) | INTRAVENOUS | Status: AC
  Administered 2015-08-07 (×2): 1 g via INTRAVENOUS
  Filled 2015-08-07 (×2): qty 50

## 2015-08-07 MED ORDER — SODIUM CHLORIDE 0.9 % IJ SOLN
INTRAMUSCULAR | Status: AC
  Filled 2015-08-07: qty 10

## 2015-08-07 MED ORDER — ONDANSETRON HCL 4 MG PO TABS: 4.0000 mg | ORAL_TABLET | Freq: Four times a day (QID) | ORAL | Status: DC | PRN

## 2015-08-07 MED ORDER — PROPOFOL 500 MG/50ML IV EMUL
INTRAVENOUS | Status: DC | PRN
  Administered 2015-08-07: 50 ug/kg/min via INTRAVENOUS

## 2015-08-07 MED ORDER — METOCLOPRAMIDE HCL 5 MG/ML IJ SOLN: 5.0000 mg | Freq: Three times a day (TID) | INTRAMUSCULAR | Status: DC | PRN

## 2015-08-07 MED ORDER — EPHEDRINE SULFATE 50 MG/ML IJ SOLN
INTRAMUSCULAR | Status: AC
  Filled 2015-08-07: qty 1

## 2015-08-07 MED ORDER — DIPHENHYDRAMINE HCL 12.5 MG/5ML PO ELIX: 12.5000 mg | ORAL_SOLUTION | ORAL | Status: DC | PRN

## 2015-08-07 MED ORDER — BUPIVACAINE HCL (PF) 0.5 % IJ SOLN
INTRAMUSCULAR | Status: DC | PRN
  Administered 2015-08-07: 3 mL via INTRATHECAL

## 2015-08-07 MED ORDER — FENTANYL CITRATE (PF) 250 MCG/5ML IJ SOLN
INTRAMUSCULAR | Status: AC
  Filled 2015-08-07: qty 5

## 2015-08-07 MED ORDER — ACETAMINOPHEN 650 MG RE SUPP: 650.0000 mg | Freq: Four times a day (QID) | RECTAL | Status: DC | PRN

## 2015-08-07 MED ORDER — MIDAZOLAM HCL 2 MG/2ML IJ SOLN
INTRAMUSCULAR | Status: AC
  Filled 2015-08-07: qty 2

## 2015-08-07 MED ORDER — LACTATED RINGERS IV SOLN
INTRAVENOUS | Status: DC | PRN
  Administered 2015-08-07 (×2): via INTRAVENOUS

## 2015-08-07 MED ORDER — 0.9 % SODIUM CHLORIDE (POUR BTL) OPTIME
TOPICAL | Status: DC | PRN
  Administered 2015-08-07: 1000 mL

## 2015-08-07 MED ORDER — HYDROMORPHONE HCL 1 MG/ML IJ SOLN
0.2500 mg | INTRAMUSCULAR | Status: DC | PRN
  Administered 2015-08-07 (×2): 0.5 mg via INTRAVENOUS

## 2015-08-07 MED ORDER — ZOLPIDEM TARTRATE 5 MG PO TABS: 5.0000 mg | ORAL_TABLET | Freq: Every evening | ORAL | Status: DC | PRN

## 2015-08-07 MED ORDER — SODIUM CHLORIDE 0.9 % IV SOLN: INTRAVENOUS | Status: DC

## 2015-08-07 MED ORDER — LIDOCAINE HCL (CARDIAC) 20 MG/ML IV SOLN
INTRAVENOUS | Status: AC
  Filled 2015-08-07: qty 5

## 2015-08-07 MED ORDER — METHOCARBAMOL 500 MG PO TABS
500.0000 mg | ORAL_TABLET | Freq: Four times a day (QID) | ORAL | Status: DC | PRN
Start: 2015-08-07 — End: 2015-08-08
  Administered 2015-08-07 – 2015-08-08 (×2): 500 mg via ORAL
  Filled 2015-08-07: qty 1

## 2015-08-07 MED ORDER — METHOCARBAMOL 500 MG PO TABS
ORAL_TABLET | ORAL | Status: AC
  Filled 2015-08-07: qty 1

## 2015-08-07 MED ORDER — METOCLOPRAMIDE HCL 5 MG PO TABS: 5.0000 mg | ORAL_TABLET | Freq: Three times a day (TID) | ORAL | Status: DC | PRN

## 2015-08-07 MED ORDER — PROMETHAZINE HCL 25 MG/ML IJ SOLN: 6.2500 mg | INTRAMUSCULAR | Status: DC | PRN

## 2015-08-07 MED ORDER — FENTANYL CITRATE (PF) 100 MCG/2ML IJ SOLN
INTRAMUSCULAR | Status: DC | PRN
  Administered 2015-08-07 (×2): 50 ug via INTRAVENOUS

## 2015-08-07 MED ORDER — MENTHOL 3 MG MT LOZG: 1.0000 | LOZENGE | OROMUCOSAL | Status: DC | PRN

## 2015-08-07 MED ORDER — ASPIRIN EC 325 MG PO TBEC
325.0000 mg | DELAYED_RELEASE_TABLET | Freq: Two times a day (BID) | ORAL | Status: DC
  Administered 2015-08-07 – 2015-08-08 (×2): 325 mg via ORAL
  Filled 2015-08-07 (×2): qty 1

## 2015-08-07 MED ORDER — PHENYLEPHRINE 40 MCG/ML (10ML) SYRINGE FOR IV PUSH (FOR BLOOD PRESSURE SUPPORT)
PREFILLED_SYRINGE | INTRAVENOUS | Status: AC
  Filled 2015-08-07: qty 10

## 2015-08-07 MED ORDER — ONDANSETRON HCL 4 MG/2ML IJ SOLN
INTRAMUSCULAR | Status: AC
  Filled 2015-08-07: qty 2

## 2015-08-07 MED ORDER — OXYCODONE HCL 5 MG PO TABS
ORAL_TABLET | ORAL | Status: AC
  Filled 2015-08-07: qty 2

## 2015-08-07 MED ORDER — DOCUSATE SODIUM 100 MG PO CAPS
100.0000 mg | ORAL_CAPSULE | Freq: Two times a day (BID) | ORAL | Status: DC
  Administered 2015-08-07 – 2015-08-08 (×2): 100 mg via ORAL
  Filled 2015-08-07 (×2): qty 1

## 2015-08-07 MED ORDER — SODIUM CHLORIDE 0.9 % IR SOLN
Status: DC | PRN
  Administered 2015-08-07: 1000 mL

## 2015-08-07 MED ORDER — PROPOFOL 10 MG/ML IV BOLUS
INTRAVENOUS | Status: AC
  Filled 2015-08-07: qty 40

## 2015-08-07 MED ORDER — OXYCODONE HCL 5 MG PO TABS
5.0000 mg | ORAL_TABLET | ORAL | Status: DC | PRN
Start: 2015-08-07 — End: 2015-08-08
  Administered 2015-08-07 – 2015-08-08 (×9): 10 mg via ORAL
  Filled 2015-08-07 (×8): qty 2

## 2015-08-07 SURGICAL SUPPLY — 50 items
BENZOIN TINCTURE PRP APPL 2/3 (GAUZE/BANDAGES/DRESSINGS) ×3 IMPLANT
BLADE SAW SGTL 18X1.27X75 (BLADE) ×2 IMPLANT
BLADE SAW SGTL 18X1.27X75MM (BLADE) ×1
BLADE SURG ROTATE 9660 (MISCELLANEOUS) IMPLANT
CAPT HIP TOTAL 2 ×3 IMPLANT
CELLS DAT CNTRL 66122 CELL SVR (MISCELLANEOUS) ×1 IMPLANT
CLOSURE WOUND 1/2 X4 (GAUZE/BANDAGES/DRESSINGS) ×1
COVER SURGICAL LIGHT HANDLE (MISCELLANEOUS) ×3 IMPLANT
DRAPE C-ARM 42X72 X-RAY (DRAPES) ×3 IMPLANT
DRAPE STERI IOBAN 125X83 (DRAPES) ×3 IMPLANT
DRAPE U-SHAPE 47X51 STRL (DRAPES) ×9 IMPLANT
DRSG AQUACEL AG ADV 3.5X10 (GAUZE/BANDAGES/DRESSINGS) ×3 IMPLANT
DURAPREP 26ML APPLICATOR (WOUND CARE) ×3 IMPLANT
ELECT BLADE 4.0 EZ CLEAN MEGAD (MISCELLANEOUS) ×3
ELECT BLADE 6.5 EXT (BLADE) IMPLANT
ELECT REM PT RETURN 9FT ADLT (ELECTROSURGICAL) ×3
ELECTRODE BLDE 4.0 EZ CLN MEGD (MISCELLANEOUS) ×1 IMPLANT
ELECTRODE REM PT RTRN 9FT ADLT (ELECTROSURGICAL) ×1 IMPLANT
FACESHIELD WRAPAROUND (MASK) ×6 IMPLANT
GLOVE BIOGEL PI IND STRL 8 (GLOVE) ×2 IMPLANT
GLOVE BIOGEL PI INDICATOR 8 (GLOVE) ×4
GLOVE ECLIPSE 8.0 STRL XLNG CF (GLOVE) ×3 IMPLANT
GLOVE ORTHO TXT STRL SZ7.5 (GLOVE) ×6 IMPLANT
GOWN STRL REUS W/ TWL LRG LVL3 (GOWN DISPOSABLE) ×2 IMPLANT
GOWN STRL REUS W/ TWL XL LVL3 (GOWN DISPOSABLE) ×2 IMPLANT
GOWN STRL REUS W/TWL LRG LVL3 (GOWN DISPOSABLE) ×6
GOWN STRL REUS W/TWL XL LVL3 (GOWN DISPOSABLE) ×6
HANDPIECE INTERPULSE COAX TIP (DISPOSABLE) ×3
KIT BASIN OR (CUSTOM PROCEDURE TRAY) ×3 IMPLANT
KIT ROOM TURNOVER OR (KITS) ×3 IMPLANT
MANIFOLD NEPTUNE II (INSTRUMENTS) ×3 IMPLANT
NS IRRIG 1000ML POUR BTL (IV SOLUTION) ×3 IMPLANT
PACK TOTAL JOINT (CUSTOM PROCEDURE TRAY) ×3 IMPLANT
PAD ARMBOARD 7.5X6 YLW CONV (MISCELLANEOUS) ×3 IMPLANT
RTRCTR WOUND ALEXIS 18CM MED (MISCELLANEOUS) ×3
SET HNDPC FAN SPRY TIP SCT (DISPOSABLE) ×1 IMPLANT
STAPLER VISISTAT 35W (STAPLE) IMPLANT
STRIP CLOSURE SKIN 1/2X4 (GAUZE/BANDAGES/DRESSINGS) ×2 IMPLANT
SUT ETHIBOND NAB CT1 #1 30IN (SUTURE) ×3 IMPLANT
SUT MNCRL AB 4-0 PS2 18 (SUTURE) IMPLANT
SUT VIC AB 0 CT1 27 (SUTURE) ×2
SUT VIC AB 0 CT1 27XBRD ANBCTR (SUTURE) ×1 IMPLANT
SUT VIC AB 1 CT1 27 (SUTURE) ×2
SUT VIC AB 1 CT1 27XBRD ANBCTR (SUTURE) ×1 IMPLANT
SUT VIC AB 2-0 CT1 27 (SUTURE) ×3
SUT VIC AB 2-0 CT1 TAPERPNT 27 (SUTURE) ×1 IMPLANT
TOWEL OR 17X24 6PK STRL BLUE (TOWEL DISPOSABLE) ×3 IMPLANT
TOWEL OR 17X26 10 PK STRL BLUE (TOWEL DISPOSABLE) ×3 IMPLANT
TRAY FOLEY CATH 16FRSI W/METER (SET/KITS/TRAYS/PACK) IMPLANT
WATER STERILE IRR 1000ML POUR (IV SOLUTION) ×6 IMPLANT

## 2015-08-07 NOTE — Brief Op Note (Signed)
08/07/2015  8:55 AM  PATIENT:  Matthew Fleming  65 y.o. male  PRE-OPERATIVE DIAGNOSIS:  Severe osteoarthritis left hip  POST-OPERATIVE DIAGNOSIS:  Severe osteoarthritis left hip  PROCEDURE:  Procedure(s): LEFT TOTAL HIP ARTHROPLASTY ANTERIOR APPROACH (Left)  SURGEON:  Surgeon(s) and Role:    * Mcarthur Rossetti, MD - Primary  PHYSICIAN ASSISTANT: Benita Stabile, PA-C  ANESTHESIA:   spinal  EBL:  Total I/O In: -  Out: 200 [Blood:200]  COUNTS:  YES  TOURNIQUET:  * No tourniquets in log *  DICTATION: .Other Dictation: Dictation Number [id number]  PLAN OF CARE: Admit to inpatient   PATIENT DISPOSITION:  PACU - hemodynamically stable.   Delay start of Pharmacological VTE agent (>24hrs) due to surgical blood loss or risk of bleeding: no

## 2015-08-07 NOTE — Addendum Note (Signed)
Addendum  created 08/07/15 1150 by Terrill Mohr, CRNA   Modules edited: Anesthesia Flowsheet

## 2015-08-07 NOTE — Transfer of Care (Signed)
Immediate Anesthesia Transfer of Care Note  Patient: Matthew Fleming  Procedure(s) Performed: Procedure(s): LEFT TOTAL HIP ARTHROPLASTY ANTERIOR APPROACH (Left)  Patient Location: PACU  Anesthesia Type:Spinal  Level of Consciousness: awake, alert , oriented and patient cooperative  Airway & Oxygen Therapy: Patient Spontanous Breathing and Patient connected to nasal cannula oxygen  Post-op Assessment: Report given to RN and Post -op Vital signs reviewed and stable  Post vital signs: Reviewed and stable  Last Vitals:  Filed Vitals:   08/07/15 0609 08/07/15 0914  BP: 142/77   Pulse: 59   Temp: 36.7 C 36.4 C  Resp: 20     Complications: No apparent anesthesia complications

## 2015-08-07 NOTE — Evaluation (Signed)
Physical Therapy Evaluation Patient Details Name: Matthew Fleming MRN: [id number] DOB: 10-27-1950 Today's Date: 08/07/2015   History of Present Illness  65 y.o. s/p Lt THA-direct anterior approach. PMH includes bilateral rotator cuff repair and osteoarthritis of left hip.  Clinical Impression  Patient presents with pain, nausea, dizziness and post surgical deficits LLE s/p Lt THA. Mobility assessment limited due to nausea and diaphoresis resulting in need to return to supine. + pallor. BP stable. Discussed positioning and exercises for pt to perform throughout the day. Encouraged OOB to chair when feeling better. Pt has support from wife at home and motivated to discharge tomorrow. Will follow acutely to maximize independence and mobility prior to return home. Plan for gait training and stair training tomorrow.    Follow Up Recommendations Home health PT;Supervision - Intermittent    Equipment Recommendations  Rolling walker with 5" wheels    Recommendations for Other Services       Precautions / Restrictions Precautions Precautions: Fall Restrictions Weight Bearing Restrictions: Yes LLE Weight Bearing: Weight bearing as tolerated      Mobility  Bed Mobility Overal bed mobility: Needs Assistance Bed Mobility: Supine to Sit;Sit to Supine     Supine to sit: Supervision Sit to supine: Supervision   General bed mobility comments: Cues for technique. Pt using UEs to bring LLE to EOB.   Transfers Overall transfer level: Needs assistance Equipment used: Rolling walker (2 wheeled) Transfers: Sit to/from Stand Sit to Stand: Min guard         General transfer comment: Min guard for safety. Stood from EOB x2. + nausea and dizziness.   Ambulation/Gait                Stairs            Wheelchair Mobility    Modified Rankin (Stroke Patients Only)       Balance Overall balance assessment: Needs assistance Sitting-balance support: Feet supported;No upper  extremity supported Sitting balance-Leahy Scale: Normal     Standing balance support: During functional activity;Bilateral upper extremity supported Standing balance-Leahy Scale: Fair Standing balance comment: Able to donn underwear in standing without UE support. Dizziness in standing. BP stable.                             Pertinent Vitals/Pain Pain Assessment: Faces Pain Score: 8  Faces Pain Scale: Hurts even more Pain Location: left hip Pain Descriptors / Indicators: Grimacing;Sore Pain Intervention(s): Monitored during session;Repositioned;Premedicated before session    Matthew Fleming expects to be discharged to:: Private residence Living Arrangements: Spouse/significant other Available Help at Discharge: Family;Available 24 hours/day Type of Home: House Home Access: Stairs to enter Entrance Stairs-Rails: Right;Left;Can reach both Entrance Stairs-Number of Steps: 4 Home Layout: One level Home Equipment: None      Prior Function Level of Independence: Independent               Hand Dominance        Extremity/Trunk Assessment   Upper Extremity Assessment: Defer to OT evaluation           Lower Extremity Assessment: LLE deficits/detail   LLE Deficits / Details: Able to perform LAQ, QS.     Communication   Communication: No difficulties  Cognition Arousal/Alertness: Awake/alert Behavior During Therapy: WFL for tasks assessed/performed Overall Cognitive Status: Within Functional Limits for tasks assessed  General Comments General comments (skin integrity, edema, etc.): Wife present in room during session.    Exercises Total Joint Exercises Ankle Circles/Pumps: Both;10 reps;Supine Quad Sets: Both;10 reps;Supine Gluteal Sets: Both;10 reps;Supine      Assessment/Plan    PT Assessment Patient needs continued PT services  PT Diagnosis Acute pain   PT Problem List Decreased  strength;Pain;Decreased activity tolerance;Decreased balance;Decreased mobility;Decreased knowledge of use of DME  PT Treatment Interventions Balance training;Gait training;Stair training;Functional mobility training;Therapeutic activities;Therapeutic exercise;Patient/family education;DME instruction   PT Goals (Current goals can be found in the Care Plan section) Acute Rehab PT Goals Patient Stated Goal: go home tomorrow PT Goal Formulation: With patient Time For Goal Achievement: 08/21/15 Potential to Achieve Goals: Good    Frequency 7X/week   Barriers to discharge Inaccessible home environment Steps to enter home    Co-evaluation               End of Session Equipment Utilized During Treatment: Gait belt Activity Tolerance: Patient limited by pain;Other (comment) (nausea) Patient left: in bed;with call bell/phone within reach;with family/visitor present;with SCD's reapplied Nurse Communication: Mobility status         Time: 1600-1630 PT Time Calculation (min) (ACUTE ONLY): 30 min   Charges:   PT Evaluation $PT Eval Low Complexity: 1 Procedure PT Treatments $Therapeutic Activity: 8-22 mins   PT G Codes:        [name] 08/07/2015, 4:45 PM Wray Kearns, [place], DPT [phone]

## 2015-08-07 NOTE — Evaluation (Signed)
Occupational Therapy Evaluation Patient Details Name: Matthew Fleming MRN: [id number] DOB: [birth year] Today's Date: 08/07/2015    History of Present Illness 65 y.o. s/p Lt THA-direct anterior approach. PMH includes bilateral rotator cuff repair and osteoarthritis of left hip.   Clinical Impression   Pt s/p above. Pt independent with ADLs, PTA. Feel pt will benefit from acute OT to increase independence prior to d/c. Do not feel pt will need follow up OT upon d/c.     Follow Up Recommendations  No OT follow up;Supervision - Intermittent    Equipment Recommendations  3 in 1 bedside comode;Other (comment) (RW with 2 wheels)    Recommendations for Other Services       Precautions / Restrictions Precautions Precautions: Fall Restrictions Weight Bearing Restrictions: Yes LLE Weight Bearing: Weight bearing as tolerated      Mobility Bed Mobility Overal bed mobility: Needs Assistance Bed Mobility: Supine to Sit     Supine to sit: Supervision        Transfers Overall transfer level: Needs assistance Equipment used: Rolling walker (2 wheeled) Transfers: Sit to/from Stand Sit to Stand: Min guard         General transfer comment: RW in front for support.    Balance      Min guard given for ambulation with RW.                                      ADL Overall ADL's : Needs assistance/impaired                     Lower Body Dressing: Sit to/from stand;Moderate assistance   Toilet Transfer: Min guard;Ambulation;RW (sit to stand from bed)           Functional mobility during ADLs: Min guard;Rolling walker General ADL Comments: Educated on LB dressing technique.     Vision     Perception     Praxis      Pertinent Vitals/Pain Pain Assessment: 0-10 Pain Score: 8  Pain Location: back and left hip Pain Descriptors / Indicators: Grimacing Pain Intervention(s): Monitored during session;Repositioned     Hand Dominance      Extremity/Trunk Assessment Upper Extremity Assessment Upper Extremity Assessment: Overall WFL for tasks assessed   Lower Extremity Assessment Lower Extremity Assessment: Defer to PT evaluation       Communication Communication Communication: No difficulties   Cognition Arousal/Alertness: Awake/alert Behavior During Therapy: WFL for tasks assessed/performed Overall Cognitive Status: Within Functional Limits for tasks assessed                     General Comments       Exercises       Shoulder Instructions      Home Living Family/patient expects to be discharged to:: Private residence Living Arrangements: Spouse/significant other Available Help at Discharge: Family;Available 24 hours/day Type of Home: House Home Access: Stairs to enter [employer] of Steps: 4 Entrance Stairs-Rails: Right;Left;Can reach both Home Layout: One level     Bathroom Shower/Tub: Tub/shower unit;Walk-in shower                    Prior Functioning/Environment Level of Independence: Independent             OT Diagnosis: Acute pain   OT Problem List: Pain;Decreased knowledge of precautions;Decreased knowledge of use of DME or AE;Decreased activity tolerance;Decreased range  of motion   OT Treatment/Interventions: DME and/or AE instruction;Therapeutic activities;Balance training;Patient/family education;Self-care/ADL training    OT Goals(Current goals can be found in the care plan section) Acute Rehab OT Goals Patient Stated Goal: go home tomorrow OT Goal Formulation: With patient Time For Goal Achievement: 08/14/15 Potential to Achieve Goals: Good ADL Goals Pt Will Perform Lower Body Dressing: sit to/from stand;with set-up (with or without AE) Pt Will Transfer to Toilet: with modified independence;ambulating (with RW) Pt Will Perform Toileting - Clothing Manipulation and hygiene: with modified independence;sit to/from stand Pt Will Perform Tub/Shower Transfer:  Shower transfer;with supervision;with set-up;rolling walker;3 in 1  OT Frequency: Min 2X/week   Barriers to D/C:            Co-evaluation              End of Session Equipment Utilized During Treatment: Gait belt;Rolling walker  Activity Tolerance: Other (comment) (felt sick on stomach and became dizzy) Patient left: in chair;with call bell/phone within reach;with family/visitor present   Time: 1351-1406 OT Time Calculation (min): 15 min Charges:  OT General Charges $OT Visit: 1 Procedure OT Evaluation $OT Eval Low Complexity: 1 Procedure G-CodesBenito Mccreedy OTR/L [id number] 08/07/2015, 2:19 PM

## 2015-08-07 NOTE — Anesthesia Procedure Notes (Signed)
Spinal Patient location during procedure: OR Staffing Anesthesiologist: Nolon Nations Performed by: anesthesiologist  Preanesthetic Checklist Completed: patient identified, site marked, surgical consent, pre-op evaluation, timeout performed, IV checked, risks and benefits discussed and monitors and equipment checked Spinal Block Patient position: sitting Prep: ChloraPrep Patient monitoring: heart rate, continuous pulse ox and blood pressure Location: L2-3 Injection technique: single-shot Needle Needle type: Sprotte  Needle gauge: 24 G Needle length: 9 cm Additional Notes Expiration date of kit checked and confirmed. Patient tolerated procedure well, without complications.

## 2015-08-07 NOTE — Progress Notes (Signed)
Orthopedic Tech Progress Note Patient Details:  Matthew Fleming 1951/01/31 [id number]  Ortho Devices Ortho Device/Splint Location: trapeze bar patient helper Ortho Device/Splint Interventions: Application   [name] 08/07/2015, 2:07 PM

## 2015-08-07 NOTE — Op Note (Signed)
NAME:  Matthew Fleming, Matthew Fleming NO.:  [id number]  MEDICAL RECORD NO.:  [id number]  LOCATION:  [facility]                         FACILITY:  [facility]  PHYSICIAN:  Lind Guest. Ninfa Linden, M.D.DATE OF BIRTH:  08-May-1951  DATE OF PROCEDURE:  08/07/2015 DATE OF DISCHARGE:                              OPERATIVE REPORT   PREOPERATIVE DIAGNOSIS:  Primary osteoarthritis and degenerative joint disease, left hip.  POSTOPERATIVE DIAGNOSIS:  Primary osteoarthritis and degenerative joint disease, left hip.  PROCEDURE:  Left total hip arthroplasty through direct anterior approach.  IMPLANTS:  DePuy Sector Gription acetabular component size 56, size 36+ 4 polyethylene liner, size 14 Corail femoral component with varus offset, size 36+ 8.5 ceramic hip ball.  SURGEON:  Mcarthur Rossetti MD.  ASSISTANT:  Erskine Emery, PA-C  ANESTHESIA:  Spinal.  ANTIBIOTICS:  2 g IV Ancef.  BLOOD LOSS:  250 mL.  COMPLICATIONS:  None.  INDICATIONS:  Matthew Fleming is a very pleasant, 65 year old gentleman who has had debilitating left hip pain for just over a year and now.  X-rays show superior lateral acetabular and femoral head where there was complete loss of joint space and periarticular osteophytes.  His pain detrimentally effects his activities of daily living, his quality of life, and his mobility to the point he does wish to proceed with a total hip arthroplasty.  He would like to have this done through the direct anterior approach.  He understands our goals are decreased pain, improved mobility, and overall improved quality of life.  He understands the risk of acute blood loss anemia, nerve and vessel injury, fracture, infection, dislocation, DVT.  PROCEDURE DESCRIPTION:  After informed consent was obtained, appropriate left hip was marked.  He was brought to the operating room, where spinal anesthesia was obtained while he was on the stretcher.  He was then placed supine on the Hana  fracture table.  The perineal post in place and both legs in inline skeletal traction boots, but no traction applied.  His left operative hip was prepped and draped with DuraPrep and sterile drapes.  A time-out was called and he was identified as correct patient, correct left hip.  We then made incision inferior and posterior to the anterior superior iliac spine and carried this leg obliquely down the leg.  We dissected down the tensor fascia lata muscle.  Tensor fascia was then divided longitudinally, so we could proceed with a direct anterior approach to the hip.  We identified and cauterized the lateral femoral circumflex vessels and then identified the hip capsule.  I opened up the hip capsule in L-type format finding a very large joint effusion.  We then placed Cobra retractors within the hip capsule and made our femoral neck cut just proximal to the lesser trochanter with an oscillating saw and completed this on osteotome.  We placed a corkscrew guide in the femoral head and removed the femoral head its entirety and found significantly due to be devoid of cartilage Harden and shiny from his superior lateral acetabular wear.  We then removed remnants of acetabular labrum.  I placed a bent Hohmann over the medial acetabular rim and then began reaming from a size 42  reamer all the way up to a size 56 with all reamers under direct visualization the last 2 reamers under direct fluoroscopy, so we could obtain our depth of reaming, our inclination, and anteversion.  Once we were pleased with this, we placed the real DePuy Sector Gription acetabular component size 56 and a 36+ 4 neutral polyethylene liner for that size acetabular component.  Attention was then turned to the femur.  With the leg externally rotated to 100 degrees extended abduct, we were able to place a Mueller retractor medially and a Hohmann retractor behind the greater trochanter.  We released the lateral joint capsule and  used a box cutting osteotome to enter femoral canal and a rongeur lateralize.  We then began broaching from a size 8 broach up to a size 14 with the 14 in place, we trialed a standard offset femoral neck and a 36+ 1.5 hip ball. We brought the leg back over and up with traction and internal rotation reducing the pelvis.  I was not pleased with his offset or leg length and I felt was a little bit unstable.  I did feel that we would be able to go with a varus offset femoral neck based off his anatomy.  We dislocated the hip and removed the trial components.  We then placed the real Corail femoral component with varus offset, size 14 and a 36+ 8.5 ceramic hip ball to get his link back as well.  We reduced this in the acetabulum.  I was pleased with stability, leg length, offset, and range of motion.  We then irrigated the joint with normal saline solution using pulsatile lavage.  We closed the joint capsule interrupted #1 Ethibond suture followed by running #1 Vicryl the tensor fascia, 0 Vicryl deep tissue 2 Vicryl subcutaneous tissue, 4-0 Monocryl subcuticular stitch, and Steri-Strips on the skin.  An Aquacel dressing was applied.  He was taken off the Hana table, and taken to recovery room in stable condition.  All final counts were correct.  There were no complications noted.  Of note, Erskine Emery, PA-C assisted in the entire case.  His assistance was crucial for facilitating all aspects of this case.     Lind Guest. Ninfa Linden, M.D.     CYB/MEDQ  D:  08/07/2015  T:  08/07/2015  Job:  [id number]

## 2015-08-07 NOTE — Progress Notes (Signed)
Orthopedic Tech Progress Note Patient Details:  Matthew Fleming [birth year] [id number]  Patient ID: Matthew Fleming, male   DOB: 1951-05-10, 65 y.o.   MRN: [id number] Viewed order from doctor's order list  [name] 08/07/2015, 2:07 PM

## 2015-08-07 NOTE — Progress Notes (Signed)
Utilization review completed.  

## 2015-08-07 NOTE — Anesthesia Postprocedure Evaluation (Signed)
Anesthesia Post Note  Patient: Matthew Fleming  Procedure(s) Performed: Procedure(s) (LRB): LEFT TOTAL HIP ARTHROPLASTY ANTERIOR APPROACH (Left)  Patient location during evaluation: PACU Anesthesia Type: Spinal and MAC Level of consciousness: awake and alert Pain management: pain level controlled Vital Signs Assessment: post-procedure vital signs reviewed and stable Respiratory status: spontaneous breathing and respiratory function stable Cardiovascular status: blood pressure returned to baseline and stable Postop Assessment: spinal receding Anesthetic complications: no    Last Vitals:  Filed Vitals:   08/07/15 1030 08/07/15 1045  BP: 128/80 135/102  Pulse: 50 99  Temp:    Resp: 13 24    Last Pain: There were no vitals filed for this visit.               Nolon Nations

## 2015-08-07 NOTE — H&P (Signed)
TOTAL HIP ADMISSION H&P  Patient is admitted for left total hip arthroplasty.  Subjective:  Chief Complaint: left hip pain  HPI: Matthew Fleming, 65 y.o. male, has a history of pain and functional disability in the left hip(s) due to arthritis and patient has failed non-surgical conservative treatments for greater than 12 weeks to include NSAID's and/or analgesics, corticosteriod injections and activity modification.  Onset of symptoms was abrupt starting 1 years ago with rapidlly worsening course since that time.The patient noted no past surgery on the left hip(s).  Patient currently rates pain in the left hip at 9 out of 10 with activity. Patient has night pain, worsening of pain with activity and weight bearing, pain that interfers with activities of daily living and pain with passive range of motion. Patient has evidence of periarticular osteophytes and joint space narrowing by imaging studies. This condition presents safety issues increasing the risk of falls.  There is no current active infection.  Patient Active Problem List   Diagnosis Date Noted  . Osteoarthritis of left hip 08/07/2015  . HEMORRHOIDS-INTERNAL 03/05/2010  . CONSTIPATION 03/05/2010  . RECTAL BLEEDING 03/05/2010   Past Medical History  Diagnosis Date  . HEMORRHOIDS-INTERNAL 03/05/2010    Qualifier: Diagnosis of  By: Chester Holstein NP, Nevin Bloodgood    . CONSTIPATION 03/05/2010    Qualifier: Diagnosis of  By: Chester Holstein NP, Nevin Bloodgood    . RECTAL BLEEDING 03/05/2010    Qualifier: Diagnosis of  By: Chester Holstein NP, Nevin Bloodgood    . Complication of anesthesia 1996    Difficulty to awaken  . Constipation   . Arthritis     Past Surgical History  Procedure Laterality Date  . Rotator cuff repair Bilateral 1996 , 2012    bilateral  . Colonoscopy w/ polypectomy    . Eye surgery      Lasik    Prescriptions prior to admission  Medication Sig Dispense Refill Last Dose  . ibuprofen (ADVIL,MOTRIN) 200 MG tablet Take 800 mg by mouth every 8 (eight)  hours as needed.   Past Month at Unknown time   No Known Allergies  Social History  Substance Use Topics  . Smoking status: Never Smoker   . Smokeless tobacco: Never Used  . Alcohol Use: 4.8 oz/week    8 Cans of beer per week    Family History  Problem Relation Age of Onset  . Pancreatic cancer Mother   . Colon cancer Father 67  . Heart disease Father   . Hypertension Mother   . Heart attack Father     64 HAD 4 MI     Review of Systems  Musculoskeletal: Positive for joint pain.  All other systems reviewed and are negative.   Objective:  Physical Exam  Constitutional: He is oriented to person, place, and time. He appears well-developed and well-nourished.  HENT:  Head: Normocephalic and atraumatic.  Eyes: EOM are normal. Pupils are equal, round, and reactive to light.  Neck: Normal range of motion. Neck supple.  Cardiovascular: Normal rate and regular rhythm.   Respiratory: Effort normal and breath sounds normal.  GI: Soft. Bowel sounds are normal.  Musculoskeletal:       Left hip: He exhibits decreased range of motion, decreased strength, tenderness and bony tenderness.  Neurological: He is alert and oriented to person, place, and time.  Skin: Skin is warm and dry.  Psychiatric: He has a normal mood and affect.    Vital signs in last 24 hours: Temp:  [98 F (36.7 C)]  98 F (36.7 C) (03/14 0609) Pulse Rate:  [59] 59 (03/14 0609) Resp:  [20] 20 (03/14 0609) BP: (142)/(77) 142/77 mmHg (03/14 0609) SpO2:  [99 %] 99 % (03/14 0609) Weight:  [81.676 kg (180 lb 1 oz)] 81.676 kg (180 lb 1 oz) (03/14 0609)  Labs:   Estimated body mass index is 25.84 kg/(m^2) as calculated from the following:   Height as of this encounter: 5\' 10"  (1.778 m).   Weight as of this encounter: 81.676 kg (180 lb 1 oz).   Imaging Review Plain radiographs demonstrate severe degenerative joint disease of the left hip(s). The bone quality appears to be excellent for age and reported activity  level.  Assessment/Plan:  End stage arthritis, left hip(s)  The patient history, physical examination, clinical judgement of the provider and imaging studies are consistent with end stage degenerative joint disease of the left hip(s) and total hip arthroplasty is deemed medically necessary. The treatment options including medical management, injection therapy, arthroscopy and arthroplasty were discussed at length. The risks and benefits of total hip arthroplasty were presented and reviewed. The risks due to aseptic loosening, infection, stiffness, dislocation/subluxation,  thromboembolic complications and other imponderables were discussed.  The patient acknowledged the explanation, agreed to proceed with the plan and consent was signed. Patient is being admitted for inpatient treatment for surgery, pain control, PT, OT, prophylactic antibiotics, VTE prophylaxis, progressive ambulation and ADL's and discharge planning.The patient is planning to be discharged home with home health services

## 2015-08-08 ENCOUNTER — Encounter (HOSPITAL_COMMUNITY): Payer: Self-pay | Admitting: Orthopaedic Surgery

## 2015-08-08 LAB — CBC
HEMATOCRIT: 38.8 % — AB (ref 39.0–52.0)
Hemoglobin: 13 g/dL (ref 13.0–17.0)
MCH: 31.4 pg (ref 26.0–34.0)
MCHC: 33.5 g/dL (ref 30.0–36.0)
MCV: 93.7 fL (ref 78.0–100.0)
Platelets: 204 10*3/uL (ref 150–400)
RBC: 4.14 MIL/uL — ABNORMAL LOW (ref 4.22–5.81)
RDW: 12.2 % (ref 11.5–15.5)
WBC: 11 10*3/uL — ABNORMAL HIGH (ref 4.0–10.5)

## 2015-08-08 LAB — BASIC METABOLIC PANEL
Anion gap: 10 (ref 5–15)
BUN: 9 mg/dL (ref 6–20)
CALCIUM: 8.9 mg/dL (ref 8.9–10.3)
CO2: 28 mmol/L (ref 22–32)
CREATININE: 0.94 mg/dL (ref 0.61–1.24)
Chloride: 99 mmol/L — ABNORMAL LOW (ref 101–111)
GFR calc non Af Amer: >60 mL/min (ref >60)
GLUCOSE: 125 mg/dL — AB (ref 65–99)
Potassium: 3.7 mmol/L (ref 3.5–5.1)
Sodium: 137 mmol/L (ref 135–145)

## 2015-08-08 MED ORDER — ASPIRIN 325 MG PO TBEC: 325.0000 mg | DELAYED_RELEASE_TABLET | Freq: Two times a day (BID) | ORAL | Status: DC

## 2015-08-08 MED ORDER — OXYCODONE HCL 5 MG PO TABS: 5.0000 mg | ORAL_TABLET | ORAL | Status: DC | PRN

## 2015-08-08 MED ORDER — METHOCARBAMOL 500 MG PO TABS: 500.0000 mg | ORAL_TABLET | Freq: Four times a day (QID) | ORAL | Status: DC | PRN

## 2015-08-08 NOTE — Progress Notes (Signed)
Occupational Therapy Treatment Patient Details Name: Matthew Fleming MRN: [id number] DOB: 17-Apr-1951 Today's Date: 08/08/2015    History of present illness 65 y.o. s/p Lt THA-direct anterior approach. PMH includes bilateral rotator cuff repair and osteoarthritis of left hip.   OT comments  Pt making good progress toward OT goals this session. Pt currently overall supervision for safety with walk in shower and toilet transfers. Educated pt and wife on home safety, encouraging mobility but not "over-doing" it with activity upon return home; pt verbalized understanding. D/c plan remains appropriate at this time. Will continue to follow acutely.   Follow Up Recommendations  No OT follow up;Supervision - Intermittent    Equipment Recommendations  3 in 1 bedside comode    Recommendations for Other Services      Precautions / Restrictions Precautions Precautions: Fall Restrictions Weight Bearing Restrictions: Yes LLE Weight Bearing: Weight bearing as tolerated       Mobility Bed Mobility Overal bed mobility: Modified Independent             General bed mobility comments: pt received up in chair  Transfers Overall transfer level: Modified independent Equipment used: Rolling walker (2 wheeled) Transfers: Sit to/from Stand Sit to Stand: Modified independent (Device/Increase time)         General transfer comment: pt stood with stability from multiple surfaces, safe hand placement reviewed    Balance Overall balance assessment: No apparent balance deficits (not formally assessed)                    ADL Overall ADL's : Needs assistance/impaired     Grooming: Supervision/safety;Standing                   Toilet Transfer: Supervision/safety;Ambulation;BSC;RW (BSC over toilet) Toilet Transfer Details (indicate cue type and reason): Educated on use of 3 in 1 over toilet Toileting- Clothing Manipulation and Hygiene: Supervision/safety;Sit to/from stand    Tub/ Shower Transfer: Walk-in shower;Supervision/safety;Ambulation;Rolling walker Tub/Shower Transfer Details (indicate cue type and reason): Educated pt on walk in shower transfer technique with use of RW. Educated on need for supervision with transfer initially. Functional mobility during ADLs: Supervision/safety;Rolling walker General ADL Comments: Pts wife present for OT session. Educated on home safety, no throw rugs on ground, not to "over-do" it with activity upon return home-give hip time to heal but encouraging movement at home; pt verbalized understanding.      Vision                     Perception     Praxis      Cognition   Behavior During Therapy: WFL for tasks assessed/performed Overall Cognitive Status: Within Functional Limits for tasks assessed                       Extremity/Trunk Assessment               Exercises    Shoulder Instructions       General Comments      Pertinent Vitals/ Pain       Pain Assessment: Faces Faces Pain Scale: Hurts little more Pain Location: L hip managing into bed Pain Descriptors / Indicators: Grimacing Pain Intervention(s): Limited activity within patient's tolerance;Monitored during session  Home Living  Prior Functioning/Environment              Frequency Min 2X/week     Progress Toward Goals  OT Goals(current goals can now be found in the care plan section)  Progress towards OT goals: Progressing toward goals  Acute Rehab OT Goals Patient Stated Goal: go home today OT Goal Formulation: With patient  Plan Discharge plan remains appropriate    Co-evaluation                 End of Session Equipment Utilized During Treatment: Rolling walker   Activity Tolerance Patient tolerated treatment well   Patient Left in bed;with call bell/phone within reach;with family/visitor present   Nurse Communication           Time: [id number] OT Time Calculation (min): 13 min  Charges: OT General Charges $OT Visit: 1 Procedure OT Treatments $Self Care/Home Management : 8-22 mins  Binnie Kand M.S., OTR/L Pager: [phone]  08/08/2015, 1:59 PM

## 2015-08-08 NOTE — Progress Notes (Signed)
Physical Therapy Treatment Patient Details Name: Matthew Fleming MRN: [id number] DOB: February 19, 1951 Today's Date: 08/08/2015    History of Present Illness 65 y.o. s/p Lt THA-direct anterior approach. PMH includes bilateral rotator cuff repair and osteoarthritis of left hip.    PT Comments    Pt practiced stairs and ambulated 250' at mod I level, wife present for session, has met acute PT goals. D/c from acute PT at this time for discharge home, HHPT to follow.   Follow Up Recommendations  Home health PT;Supervision - Intermittent     Equipment Recommendations  Rolling walker with 5" wheels    Recommendations for Other Services       Precautions / Restrictions Precautions Precautions: Fall Restrictions Weight Bearing Restrictions: Yes LLE Weight Bearing: Weight bearing as tolerated    Mobility  Bed Mobility Overal bed mobility: Modified Independent Bed Mobility: Supine to Sit;Sit to Supine     Supine to sit: Modified independent (Device/Increase time) Sit to supine: Modified independent (Device/Increase time)   General bed mobility comments: pt able to get in and out of bed without assist  Transfers Overall transfer level: Modified independent Equipment used: Rolling walker (2 wheeled) Transfers: Sit to/from Stand Sit to Stand: Modified independent (Device/Increase time)            Ambulation/Gait Ambulation/Gait assistance: Modified independent (Device/Increase time) Ambulation Distance (Feet): 250 Feet Assistive device: Rolling walker (2 wheeled) Gait Pattern/deviations: Step-through pattern;Antalgic Gait velocity: decreased Gait velocity interpretation: Below normal speed for age/gender General Gait Details: did not increase ambulation distance from AM session because pt preparing to d/c and will have to walk more for return home   Stairs Stairs: Yes Stairs assistance: Modified independent (Device/Increase time) Stair Management: One rail Right;Step to  pattern;Forwards Number of Stairs: 5 (2x) General stair comments: pt ascended and descended stairs safely and without difficulty, wife present for education as well  Wheelchair Mobility    Modified Rankin (Stroke Patients Only)       Balance Overall balance assessment: Modified Independent   Sitting balance-Leahy Scale: Normal       Standing balance-Leahy Scale: Good                      Cognition Arousal/Alertness: Awake/alert Behavior During Therapy: WFL for tasks assessed/performed Overall Cognitive Status: Within Functional Limits for tasks assessed                      Exercises Total Joint Exercises Ankle Circles/Pumps: Both;20 reps;Supine Quad Sets: Both;10 reps;Supine Gluteal Sets: Both;10 reps;Supine    General Comments        Pertinent Vitals/Pain Pain Assessment: Faces Faces Pain Scale: Hurts little more Pain Location: left hip Pain Descriptors / Indicators: Sore Pain Intervention(s): Limited activity within patient's tolerance;Monitored during session;Premedicated before session    Home Living                      Prior Function            PT Goals (current goals can now be found in the care plan section) Acute Rehab PT Goals Patient Stated Goal: go home today PT Goal Formulation: With patient Time For Goal Achievement: 08/21/15 Potential to Achieve Goals: Good Progress towards PT goals: Goals met/education completed, patient discharged from PT    Frequency  7X/week    PT Plan Current plan remains appropriate    Co-evaluation  End of Session   Activity Tolerance: Patient tolerated treatment well Patient left: with call bell/phone within reach;in chair;with family/visitor present     Time: 1103-1594 PT Time Calculation (min) (ACUTE ONLY): 20 min  Charges:  $Gait Training: 8-22 mins                    G Codes:     Leighton Roach, PT  Acute Rehab Services  [phone]  Leighton Roach 08/08/2015, 3:26 PM

## 2015-08-08 NOTE — Care Management Note (Signed)
Case Management Note  Patient Details  Name: Matthew Fleming MRN: [id number] Date of Birth: [birth year]  Subjective/Objective:          S/p left total hip arthroplasty          Action/Plan: Set up with [facility] for HHPT by MD office. Spoke with patient, no change in discharge plan. Patient stated that his wife with be assisting him after discharge. Contacted James at [facility] and requested rolling walker and 3N1 be delivered to patient's room.      Expected Discharge Date:                  Expected Discharge Plan:  [place]  In-House Referral:  NA  Discharge planning Services  CM Consult  Post Acute Care Choice:  Home Health, Durable Medical Equipment Choice offered to:  Patient  DME Arranged:  3-N-1, Walker rolling DME Agency:  [place]:  PT [place]:  [place]  Status of Service:  Completed, signed off  Medicare Important Message Given:    Date Medicare IM Given:    Medicare IM give by:    Date Additional Medicare IM Given:    Additional Medicare Important Message give by:     If discussed at [place] of Stay Meetings, dates discussed:    Additional Comments:  Nila Nephew, RN 08/08/2015, 9:59 AM

## 2015-08-08 NOTE — Progress Notes (Signed)
OT Cancellation Note  Patient Details Name: Matthew Fleming MRN: [id number] DOB: 03/16/1951   Cancelled Treatment:    Reason Eval/Treat Not Completed: Pain limiting ability to participate. Pt declining to work with OT at this time, requesting for OT to check back later for treatment secondary to pain. Will follow up as time allows.  Binnie Kand M.S., OTR/L Pager: [phone]  08/08/2015, 10:15 AM

## 2015-08-08 NOTE — Discharge Instructions (Signed)

## 2015-08-08 NOTE — Progress Notes (Signed)
Subjective: 1 Day Post-Op Procedure(s) (LRB): LEFT TOTAL HIP ARTHROPLASTY ANTERIOR APPROACH (Left) Patient reports pain as moderate.  Some post-op nausea, but has been up.  Objective: Vital signs in last 24 hours: Temp:  [97.5 F (36.4 C)-99.3 F (37.4 C)] 99.3 F (37.4 C) (03/15 0500) Pulse Rate:  [46-99] 77 (03/15 0500) Resp:  [11-25] 18 (03/15 0500) BP: (102-135)/(57-102) 119/70 mmHg (03/15 0500) SpO2:  [94 %-100 %] 94 % (03/15 0500)  Intake/Output from previous day: 03/14 0701 - 03/15 0700 In: 1475 [P.O.:275; I.V.:1200] Out: 800 [Urine:600; Blood:200] Intake/Output this shift:    No results for input(s): HGB in the last 72 hours. No results for input(s): WBC, RBC, HCT, PLT in the last 72 hours. No results for input(s): NA, K, CL, CO2, BUN, CREATININE, GLUCOSE, CALCIUM in the last 72 hours. No results for input(s): LABPT, INR in the last 72 hours.  Sensation intact distally Intact pulses distally Dorsiflexion/Plantar flexion intact Incision: scant drainage  Assessment/Plan: 1 Day Post-Op Procedure(s) (LRB): LEFT TOTAL HIP ARTHROPLASTY ANTERIOR APPROACH (Left) Up with therapy Discharge home with home health possibly this afternoon vs tomorrow.  Deliliah Spranger Y 08/08/2015, 7:34 AM

## 2015-08-08 NOTE — Discharge Summary (Signed)
Patient ID: Matthew Fleming MRN: [id number] DOB/AGE: [birth year] 63 y.o.  Admit date: 08/07/2015 Discharge date: 08/08/2015  Admission Diagnoses:  Principal Problem:   Osteoarthritis of left hip Active Problems:   Status post total replacement of left hip   Discharge Diagnoses:  Same  Past Medical History  Diagnosis Date  . HEMORRHOIDS-INTERNAL 03/05/2010    Qualifier: Diagnosis of  By: Chester Holstein NP, Nevin Bloodgood    . CONSTIPATION 03/05/2010    Qualifier: Diagnosis of  By: Chester Holstein NP, Nevin Bloodgood    . RECTAL BLEEDING 03/05/2010    Qualifier: Diagnosis of  By: Chester Holstein NP, Nevin Bloodgood    . Complication of anesthesia 1996    Difficulty to awaken  . Arthritis     "left hip" (08/07/2015)    Surgeries: Procedure(s): LEFT TOTAL HIP ARTHROPLASTY ANTERIOR APPROACH on 08/07/2015   Consultants:    Discharged Condition: Improved  Hospital Course: RAUL HOWALD is an 65 y.o. male who was admitted 08/07/2015 for operative treatment ofOsteoarthritis of left hip. Patient has severe unremitting pain that affects sleep, daily activities, and work/hobbies. After pre-op clearance the patient was taken to the operating room on 08/07/2015 and underwent  Procedure(s): LEFT TOTAL HIP ARTHROPLASTY ANTERIOR APPROACH.    Patient was given perioperative antibiotics: Anti-infectives    Start     Dose/Rate Route Frequency Ordered Stop   08/07/15 1400  ceFAZolin (ANCEF) IVPB 1 g/50 mL premix     1 g 100 mL/hr over 30 Minutes Intravenous Every 6 hours 08/07/15 1322 08/07/15 2103   08/07/15 0700  ceFAZolin (ANCEF) IVPB 2 g/50 mL premix     2 g 100 mL/hr over 30 Minutes Intravenous To ShortStay Surgical 08/06/15 1339 08/07/15 0805       Patient was given sequential compression devices, early ambulation, and chemoprophylaxis to prevent DVT.  Patient benefited maximally from hospital stay and there were no complications.    Recent vital signs: Patient Vitals for the past 24 hrs:  BP Temp Temp src Pulse Resp SpO2   08/08/15 0500 119/70 mmHg 99.3 F (37.4 C) Oral 77 18 94 %  08/07/15 2300 128/87 mmHg 98.8 F (37.1 C) - 91 18 98 %  08/07/15 2100 126/83 mmHg 98.4 F (36.9 C) - 70 18 97 %  08/07/15 1215 118/78 mmHg 98.1 F (36.7 C) - 61 14 97 %  08/07/15 1200 133/90 mmHg - - 66 (!) 21 100 %  08/07/15 1145 (!) 132/91 mmHg - - 62 (!) 25 100 %  08/07/15 1130 127/84 mmHg - - 63 17 100 %  08/07/15 1115 129/86 mmHg - - (!) 57 11 100 %  08/07/15 1100 123/80 mmHg - - (!) 53 11 98 %  08/07/15 1045 (!) 135/102 mmHg - - 99 (!) 24 99 %  08/07/15 1030 128/80 mmHg - - (!) 50 13 100 %  08/07/15 1015 119/76 mmHg - - (!) 46 16 100 %  08/07/15 1000 111/74 mmHg - - (!) 48 15 100 %  08/07/15 0945 107/69 mmHg - - (!) 48 13 100 %  08/07/15 0930 (!) 102/57 mmHg - - (!) 50 12 100 %  08/07/15 0915 - - - (!) 51 17 100 %  08/07/15 0914 106/66 mmHg 97.5 F (36.4 C) - 62 18 100 %     Recent laboratory studies: No results for input(s): WBC, HGB, HCT, PLT, NA, K, CL, CO2, BUN, CREATININE, GLUCOSE, INR, CALCIUM in the last 72 hours.  Invalid input(s): PT, 2   Discharge Medications:  Medication List    STOP taking these medications        ibuprofen 200 MG tablet  Commonly known as:  ADVIL,MOTRIN      TAKE these medications        aspirin 325 MG EC tablet  Take 1 tablet (325 mg total) by mouth 2 (two) times daily after a meal.     methocarbamol 500 MG tablet  Commonly known as:  ROBAXIN  Take 1 tablet (500 mg total) by mouth every 6 (six) hours as needed for muscle spasms.     oxyCODONE 5 MG immediate release tablet  Commonly known as:  Oxy IR/ROXICODONE  Take 1-2 tablets (5-10 mg total) by mouth every 4 (four) hours as needed for severe pain or breakthrough pain.        Diagnostic Studies: Dg Hip Port Unilat With Pelvis 1v Left  08/07/2015  CLINICAL DATA:  Post left hip replacement EXAM: DG HIP (WITH OR WITHOUT PELVIS) 1V PORT LEFT COMPARISON:  None. FINDINGS: Changes of left hip replacement. Normal  alignment. No hardware or bony complicating feature. Soft tissue gas noted. IMPRESSION: Left hip replacement without visible complicating feature. Electronically Signed   By: Rolm Baptise M.D.   On: 08/07/2015 10:15   Dg Hip Operative Unilat With Pelvis Left  08/07/2015  CLINICAL DATA:  LEFT anterior total hip arthroplasty EXAM: OPERATIVE LEFT HIP (WITH PELVIS IF PERFORMED) 2 VIEWS TECHNIQUE: Fluoroscopic spot image(s) were submitted for interpretation post-operatively. FLUOROSCOPY TIME:  0 minutes 34 seconds Images obtained:  2 COMPARISON:  MRI LEFT hip 11/18/2014 FINDINGS: LEFT hip prosthesis identified in expected position. No fracture or dislocation seen. Bones appear demineralized. IMPRESSION: LEFT hip prosthesis without acute bony abnormality. Electronically Signed   By: Lavonia Dana M.D.   On: 08/07/2015 08:58    Disposition: to home      Discharge Instructions    Call MD / Call 911    Complete by:  As directed   If you experience chest pain or shortness of breath, CALL 911 and be transported to the hospital emergency room.  If you develope a fever above 101 F, pus (white drainage) or increased drainage or redness at the wound, or calf pain, call your surgeon's office.     Constipation Prevention    Complete by:  As directed   Drink plenty of fluids.  Prune juice may be helpful.  You may use a stool softener, such as Colace (over the counter) 100 mg twice a day.  Use MiraLax (over the counter) for constipation as needed.     Diet - low sodium heart healthy    Complete by:  As directed      Discharge patient    Complete by:  As directed      Increase activity slowly as tolerated    Complete by:  As directed            Follow-up Information    Follow up with [facility].   Why:  They will contact you to schedule home therapy visits.   Contact information:   [address] [place] [address] [phone]       Follow up with Mcarthur Rossetti, MD In 2  weeks.   Specialty:  Orthopedic Surgery   Contact information:   [place] [address] [phone]        Signed: Mcarthur Rossetti 08/08/2015, 7:45 AM

## 2015-08-08 NOTE — Progress Notes (Signed)
Physical Therapy Treatment Patient Details Name: Matthew Fleming MRN: [id number] DOB: 05-29-1950 Today's Date: 08/08/2015    History of Present Illness 65 y.o. s/p Lt THA-direct anterior approach. PMH includes bilateral rotator cuff repair and osteoarthritis of left hip.    PT Comments    Pt progressing very well today, ambulated 250' with RW, mod I. Not nauseous or dizzy with activity today. Should be ready for d/c home after PM session.   Follow Up Recommendations  Home health PT;Supervision - Intermittent     Equipment Recommendations  Rolling walker with 5" wheels    Recommendations for Other Services       Precautions / Restrictions Restrictions Weight Bearing Restrictions: Yes LLE Weight Bearing: Weight bearing as tolerated    Mobility  Bed Mobility               General bed mobility comments: pt received up in chair  Transfers Overall transfer level: Modified independent Equipment used: Rolling walker (2 wheeled) Transfers: Sit to/from Stand Sit to Stand: Modified independent (Device/Increase time)         General transfer comment: pt stood with stability from multiple surfaces, safe hand placement reviewed  Ambulation/Gait Ambulation/Gait assistance: Modified independent (Device/Increase time) Ambulation Distance (Feet): 250 Feet Assistive device: Rolling walker (2 wheeled) Gait Pattern/deviations: Step-through pattern;Antalgic Gait velocity: decreased Gait velocity interpretation: Below normal speed for age/gender General Gait Details: worked on smooth, even gait pattern, pt lacks left hip extension, pitch fwd noted with gait. Pt wants to get off RW ASAP, encouraged him to not rush this process and use it until gait pattern [facility]   Stairs            Wheelchair Mobility    Modified Rankin (Stroke Patients Only)       Balance Overall balance assessment: Modified Independent Sitting-balance support: Feet supported Sitting balance-Leahy  Scale: Normal     Standing balance support: No upper extremity supported Standing balance-Leahy Scale: Good Standing balance comment: pt not dizzy this morning, stable in standing even without UE support                    Cognition Arousal/Alertness: Awake/alert Behavior During Therapy: WFL for tasks assessed/performed Overall Cognitive Status: Within Functional Limits for tasks assessed                      Exercises Total Joint Exercises Ankle Circles/Pumps: Both;Seated;20 reps Quad Sets: Both;10 reps;Seated Gluteal Sets: Both;10 reps;Seated Heel Slides: AROM;Left;10 reps;Seated Hip ABduction/ADduction: AROM;Left;10 reps;Seated Straight Leg Raises: AAROM;Left;10 reps;Seated Long Arc Quad: AROM;Left;10 reps;Seated Knee Flexion: AROM;Left;10 reps;Standing Marching in Standing: AROM;Both;10 reps;Standing Standing Hip Extension: AROM;Left;10 reps;Standing    General Comments General comments (skin integrity, edema, etc.): discussed car transfer as well as activity level upon d/c      Pertinent Vitals/Pain Pain Assessment: Faces Faces Pain Scale: Hurts little more Pain Location: left hip Pain Descriptors / Indicators: Grimacing Pain Intervention(s): Monitored during session;Premedicated before session    Home Living                      Prior Function            PT Goals (current goals can now be found in the care plan section) Acute Rehab PT Goals Patient Stated Goal: go home today PT Goal Formulation: With patient Time For Goal Achievement: 08/21/15 Potential to Achieve Goals: Good Progress towards PT goals: Progressing toward goals    Frequency  7X/week    PT Plan Current plan remains appropriate    Co-evaluation             End of Session Equipment Utilized During Treatment: Gait belt Activity Tolerance: Patient tolerated treatment well Patient left: with call bell/phone within reach;in chair     Time: [id number] PT  Time Calculation (min) (ACUTE ONLY): 41 min  Charges:  $Gait Training: 23-37 mins $Therapeutic Exercise: 8-22 mins                    G Codes:     Leighton Roach, PT  Acute Rehab Services  [phone]  Leighton Roach 08/08/2015, 10:10 AM

## 2016-01-07 ENCOUNTER — Other Ambulatory Visit: Payer: Self-pay | Admitting: Orthopaedic Surgery

## 2016-01-07 DIAGNOSIS — M25552 Pain in left hip: Secondary | ICD-10-CM

## 2016-01-11 ENCOUNTER — Other Ambulatory Visit: Payer: BLUE CROSS/BLUE SHIELD

## 2017-03-03 ENCOUNTER — Ambulatory Visit: Payer: BLUE CROSS/BLUE SHIELD | Admitting: Family Medicine

## 2017-03-11 ENCOUNTER — Encounter: Payer: Self-pay | Admitting: Family Medicine

## 2017-03-11 ENCOUNTER — Ambulatory Visit (INDEPENDENT_AMBULATORY_CARE_PROVIDER_SITE_OTHER): Payer: BLUE CROSS/BLUE SHIELD | Admitting: Family Medicine

## 2017-03-11 VITALS — BP 138/90 | HR 62 | Ht 69.0 in | Wt 179.2 lb

## 2017-03-11 DIAGNOSIS — Z0001 Encounter for general adult medical examination with abnormal findings: Secondary | ICD-10-CM

## 2017-03-11 DIAGNOSIS — R1032 Left lower quadrant pain: Secondary | ICD-10-CM | POA: Diagnosis not present

## 2017-03-11 DIAGNOSIS — R030 Elevated blood-pressure reading, without diagnosis of hypertension: Secondary | ICD-10-CM | POA: Diagnosis not present

## 2017-03-11 DIAGNOSIS — Z23 Encounter for immunization: Secondary | ICD-10-CM

## 2017-03-11 LAB — COMPREHENSIVE METABOLIC PANEL
ALBUMIN: 4.7 g/dL (ref 3.5–5.2)
ALT: 20 U/L (ref 0–53)
AST: 19 U/L (ref 0–37)
Alkaline Phosphatase: 55 U/L (ref 39–117)
BILIRUBIN TOTAL: 0.9 mg/dL (ref 0.2–1.2)
BUN: 17 mg/dL (ref 6–23)
CALCIUM: 9.9 mg/dL (ref 8.4–10.5)
CHLORIDE: 102 meq/L (ref 96–112)
CO2: 27 meq/L (ref 19–32)
CREATININE: 1 mg/dL (ref 0.40–1.50)
GFR: 79.85 mL/min (ref >60.00)
Glucose, Bld: 98 mg/dL (ref 70–99)
Potassium: 4.4 mEq/L (ref 3.5–5.1)
SODIUM: 140 meq/L (ref 135–145)
Total Protein: 7 g/dL (ref 6.0–8.3)

## 2017-03-11 LAB — HEMOGLOBIN A1C: HEMOGLOBIN A1C: 5.6 % (ref 4.6–6.5)

## 2017-03-11 LAB — LIPID PANEL
CHOLESTEROL: 196 mg/dL (ref 0–200)
HDL: 85.9 mg/dL (ref >39.00)
LDL CALC: 97 mg/dL (ref 0–99)
NONHDL: 109.61
Total CHOL/HDL Ratio: 2
Triglycerides: 61 mg/dL (ref 0.0–149.0)
VLDL: 12.2 mg/dL (ref 0.0–40.0)

## 2017-03-11 LAB — CBC
HCT: 45.6 % (ref 39.0–52.0)
Hemoglobin: 15.3 g/dL (ref 13.0–17.0)
MCHC: 33.5 g/dL (ref 30.0–36.0)
MCV: 97.6 fl (ref 78.0–100.0)
PLATELETS: 218 10*3/uL (ref 150.0–400.0)
RBC: 4.67 Mil/uL (ref 4.22–5.81)
RDW: 12.9 % (ref 11.5–15.5)
WBC: 5.9 10*3/uL (ref 4.0–10.5)

## 2017-03-11 LAB — POCT URINALYSIS DIPSTICK
BILIRUBIN UA: NEGATIVE
GLUCOSE UA: NEGATIVE
Ketones, UA: NEGATIVE
Leukocytes, UA: NEGATIVE
Nitrite, UA: NEGATIVE
PH UA: 7 (ref 5.0–8.0)
Protein, UA: NEGATIVE
RBC UA: NEGATIVE
SPEC GRAV UA: 1.015 (ref 1.010–1.025)
Urobilinogen, UA: 0.2 E.U./dL

## 2017-03-11 LAB — TSH: TSH: 1.61 u[IU]/mL (ref 0.35–4.50)

## 2017-03-11 LAB — PSA: PSA: 0.48 ng/mL (ref 0.10–4.00)

## 2017-03-11 NOTE — Patient Instructions (Signed)
Sports Hernia Rehab Ask your health care provider which exercises are safe for you. Do exercises exactly as told by your health care provider and adjust them as directed. It is normal to feel mild stretching, pulling, tightness, or discomfort as you do these exercises, but you should stop right away if you feel sudden pain or your pain gets worse. Do not begin these exercises until told by your health care provider. Stretching and range of motion exercises These exercises warm up your muscles and joints and improve the movement and flexibility around your hip and pelvis. Exercise A: V-sit ( hamstrings and adductors) Preventive Care 40-64 Years, Male Preventive care refers to lifestyle choices and visits with your health care provider that can promote health and wellness. What does preventive care include?  A yearly physical exam. This is also called an annual well check.  Dental exams once or twice a year.  Routine eye exams. Ask your health care provider how often you should have your eyes checked.  Personal lifestyle choices, including: ? Daily care of your teeth and gums. ? Regular physical activity. ? Eating a healthy diet. ? Avoiding tobacco and drug use. ? Limiting alcohol use. ? Practicing safe sex. ? Taking low-dose aspirin every day starting at age 70. What happens during an annual well check? The services and screenings done by your health care provider during your annual well check will depend on your age, overall health, lifestyle risk factors, and family history of disease. Counseling Your health care provider may ask you questions about your:  Alcohol use.  Tobacco use.  Drug use.  Emotional well-being.  Home and relationship well-being.  Sexual activity.  Eating habits.  Work and work Statistician.  Screening You may have the following tests or measurements:  Height, weight, and BMI.  Blood pressure.  Lipid and cholesterol levels. These may be checked  every 5 years, or more frequently if you are over 68 years old.  Skin check.  Lung cancer screening. You may have this screening every year starting at age 30 if you have a 30-pack-year history of smoking and currently smoke or have quit within the past 15 years.  Fecal occult blood test (FOBT) of the stool. You may have this test every year starting at age 44.  Flexible sigmoidoscopy or colonoscopy. You may have a sigmoidoscopy every 5 years or a colonoscopy every 10 years starting at age 71.  Prostate cancer screening. Recommendations will vary depending on your family history and other risks.  Hepatitis C blood test.  Hepatitis B blood test.  Sexually transmitted disease (STD) testing.  Diabetes screening. This is done by checking your blood sugar (glucose) after you have not eaten for a while (fasting). You may have this done every 1-3 years.  Discuss your test results, treatment options, and if necessary, the need for more tests with your health care provider. Vaccines Your health care provider may recommend certain vaccines, such as:  Influenza vaccine. This is recommended every year.  Tetanus, diphtheria, and acellular pertussis (Tdap, Td) vaccine. You may need a Td booster every 10 years.  Varicella vaccine. You may need this if you have not been vaccinated.  Zoster vaccine. You may need this after age 9.  Measles, mumps, and rubella (MMR) vaccine. You may need at least one dose of MMR if you were born in 1957 or later. You may also need a second dose.  Pneumococcal 13-valent conjugate (PCV13) vaccine. You may need this if you have certain  conditions and have not been vaccinated.  Pneumococcal polysaccharide (PPSV23) vaccine. You may need one or two doses if you smoke cigarettes or if you have certain conditions.  Meningococcal vaccine. You may need this if you have certain conditions.  Hepatitis A vaccine. You may need this if you have certain conditions or if you  travel or work in places where you may be exposed to hepatitis A.  Hepatitis B vaccine. You may need this if you have certain conditions or if you travel or work in places where you may be exposed to hepatitis B.  Haemophilus influenzae type b (Hib) vaccine. You may need this if you have certain risk factors.  Talk to your health care provider about which screenings and vaccines you need and how often you need them. This information is not intended to replace advice given to you by your health care provider. Make sure you discuss any questions you have with your health care provider. Document Released: 06/08/2015 Document Revised: 01/30/2016 Document Reviewed: 03/13/2015 Elsevier Interactive Patient Education  2017 Elsevier Inc.  1. Sit on the floor with your legs extended in a large "V" shape. Keep your knees straight during this exercise. 2. Start with your head and chest upright, then bend at your waist to reach for your left foot (position A). You should feel a stretch in your right inner thigh. 3. Hold this position for __________ seconds. Then slowly return to the upright position. 4. Bend at your waist to reach forward (position B). You should feel a stretch behind both of your thighs and knees. 5. Hold this position for __________ seconds. Then slowly return to the upright position. 6. Bend at your waist to reach for your right foot (position C). You should feel a stretch in your left inner thigh. 7. Hold this position for __________ seconds. Then slowly return to the upright position. Repeat __________ times. Complete this exercise __________ times a day. Strengthening exercises These exercises build strength around your hip and pelvis. Exercise B: Hip adduction, isometric  1. Sit on a firm chair. Your knees should be at about the same height as your hips. 2. Place a large ball, firm pillow, or rolled-up bath towel between your thighs. 3. Squeeze your thighs together, gradually  building tension. 4. Hold this position for __________ seconds. 5. Let your muscles relax completely before doing another repetition. Repeat __________ times. Complete this exercise __________ times a day. Exercise C: Hip abduction, isometric 1. Sit on a firm chair. Your knees should be at about the same height as your hips. 2. Place one of your hands on the outside of each of your thighs, just above your knees. 3. Push your thighs away from each other against your hands, gradually building tension. 4. Hold this position for __________ seconds. 5. Let your muscles relax completely before doing another repetition. Repeat __________ times. Complete this exercise __________ times a day. Exercise D: Pelvic tilt 1. Lie on your back on a firm bed or the floor. 2. Bend your knees and keep your feet flat. 3. Tense your abdominal muscles and press your lower back into the bed or floor. Do not hold your breath. 4. Hold this position for __________ seconds. 5. Return to the starting position. Let your muscles relax completely before doing another repetition. Repeat __________ times. Complete this exercise __________ times a day. Exercise E: Hip adduction 1. Lie on your side with your left / right leg on the bottom. 2. Bend your top knee and  place your left / right foot flat on the floor for balance. It may be in front or behind your bottom leg. 3. Lift your bottom leg about 6 inches (15 cm) into the air. Do not roll your body forward or backward. 4. Hold this position for __________ seconds. 5. Slowly return to the starting position. Repeat __________ times. Complete this exercise __________ times a day. This information is not intended to replace advice given to you by your health care provider. Make sure you discuss any questions you have with your health care provider. Document Released: 05/12/2005 Document Revised: 01/15/2016 Document Reviewed: 01/16/2015 Elsevier Interactive Patient Education   [employer].

## 2017-03-11 NOTE — Progress Notes (Signed)
Subjective:  Matthew Fleming is a 66 y.o. male who presents today for his annual comprehensive physical exam and to establish care.  HPI:  Patient is also concerned about groin pain today.  Groin Pain, New problem Several year history. Located in the left side of his groin.  Worse over the last few months. Feels like a pulling sensation. Worse after exercise and being active. No treatments tried. No nausea or vomiting. No constipation or diarrhea.   PFSH: Has a history of left hip replacement in 2017.  Lifestyle Diet: Endorses several dietary indiscretions over the past several months. He is trying to have a more healthy diet. Exercise: Not currently regularly exercising though does walk his dog about 2 miles a day. Trying to be more active.   Depression screen PHQ 2/9 03/11/2017  Decreased Interest 0  Down, Depressed, Hopeless 0  PHQ - 2 Score 0    Health Maintenance Due  Topic Date Due  . Hepatitis C Screening  [birth year]  . HIV Screening  10/08/1967  . TETANUS/TDAP  10/08/1971    ROS: Per HPI, otherwise a 14 point review of systems was performed and was negative  PMH:  The following were reviewed and entered/updated in epic: Past Medical History:  Diagnosis Date  . Arthritis    "left hip" (08/07/2015)  . Complication of anesthesia 1996   Difficulty to awaken  . CONSTIPATION 03/05/2010   Qualifier: Diagnosis of  By: Chester Holstein NP, Nevin Bloodgood    . HEMORRHOIDS-INTERNAL 03/05/2010   Qualifier: Diagnosis of  By: Chester Holstein NP, Nevin Bloodgood    . RECTAL BLEEDING 03/05/2010   Qualifier: Diagnosis of  By: Chester Holstein NP, Nevin Bloodgood     Patient Active Problem List   Diagnosis Date Noted  . Osteoarthritis of left hip 08/07/2015  . Status post total replacement of left hip 08/07/2015  . HEMORRHOIDS-INTERNAL 03/05/2010  . CONSTIPATION 03/05/2010  . RECTAL BLEEDING 03/05/2010   Past Surgical History:  Procedure Laterality Date  . COLONOSCOPY W/ POLYPECTOMY    . JOINT REPLACEMENT    .  REFRACTIVE SURGERY Bilateral   . SHOULDER ARTHROSCOPY W/ ROTATOR CUFF REPAIR Bilateral 1996 , 2012  . TOTAL HIP ARTHROPLASTY Left 08/07/2015  . TOTAL HIP ARTHROPLASTY Left 08/07/2015   Procedure: LEFT TOTAL HIP ARTHROPLASTY ANTERIOR APPROACH;  Surgeon: Mcarthur Rossetti, MD;  Location: [facility];  Service: Orthopedics;  Laterality: Left;    Family History  Problem Relation Age of Onset  . Pancreatic cancer Mother   . Hypertension Mother   . Colon cancer Father 67  . Heart disease Father   . Heart attack Father        32 HAD 4 MI    Medications- reviewed and updated No current outpatient prescriptions on file.   No current facility-administered medications for this visit.    Allergies-reviewed and updated No Known Allergies  Social History   Social History  . Marital status: Married    Spouse name: N/A  . Number of children: N/A  . Years of education: N/A   Occupational History  . Chemist    . Retired    Social History Main Topics  . Smoking status: Never Smoker  . Smokeless tobacco: Never Used  . Alcohol use 4.8 oz/week    8 Cans of beer per week  . Drug use: No  . Sexual activity: Yes   Other Topics Concern  . None   Social History Narrative  . None   Objective:  Physical Exam: BP 138/90  Pulse 62   Ht 5\' 9"  (1.753 m)   Wt 179 lb 3.2 oz (81.3 kg)   SpO2 98%   BMI 26.46 kg/m   Body mass index is 26.46 kg/m. Gen: NAD, resting comfortably HEENT: TMs normal bilaterally. OP clear. No thyromegaly noted.  CV: RRR with no murmurs appreciated Pulm: NWOB, CTAB with no crackles, wheezes, or rhonchi GI: Normal bowel sounds present. Soft, Nontender, Nondistended. ZH:YQMVHQ male externa genitalia. Small left inguinal hernia noted. Prostate normal in size without nodules. MSK: Hip abduction and adduction 5/5 bilaterally. Mild pain in groin elicited with resisted left hip abduction. Skin: warm, dry Neuro: CN2-12 grossly intact. Strength 5/5 in upper and lower  extremities. Reflexes symmetric and intact bilaterally.  Psych: Normal affect and thought content  Results for orders placed or performed in visit on 03/11/17 (from the past 72 hour(s))  POCT urinalysis dipstick     Status: None   Collection Time: 03/11/17  9:32 AM  Result Value Ref Range   Color, UA Yellow    Clarity, UA Clear    Glucose, UA Negative    Bilirubin, UA Negative    Ketones, UA Negative    Spec Grav, UA 1.015 1.010 - 1.025   Blood, UA Negative    pH, UA 7.0 5.0 - 8.0   Protein, UA Negative    Urobilinogen, UA 0.2 0.2 or 1.0 E.U./dL   Nitrite, UA Negative    Leukocytes, UA Negative Negative    Assessment/Plan:  Groin Pain Unclear how much is related to inguinal hernia vs left hip OA s/p replacement vs possible sports hernia. Patient has been established with surgeon already- will be following up with them to discuss repair of his hernia. Gave some handouts and discussed exercise program to strengthen groin muscle groups. Advised patient to take ibuprofen or naproxen as needed for pain. Return precautions reviewed.  Elevated Blood pressure reading Likely related to recent dietary indiscretions. Per patient he is typically 120s/80s. Advised patient to work on lifestyle changes and continue home blood pressure monitoring. Return precautions reviewed.   Preventative Healthcare: Flu shot deferred today. Tdap given. Check CBC, CMET, A1c, lipid panel, TSH, PSA, HIV antibody, and HCV antibody.   Patient Counseling:  -Nutrition: Stressed importance of moderation in sodium/caffeine intake, saturated fat and cholesterol, caloric balance, sufficient intake of fresh fruits, vegetables, and fiber.  -Stressed the importance of regular exercise.   -Substance Abuse: Discussed cessation/primary prevention of tobacco, alcohol, or other drug use; driving or other dangerous activities under the influence; availability of treatment for abuse.   -Injury prevention: Discussed safety belts,  safety helmets, smoke detector, smoking near bedding or upholstery.   -Sexuality: Discussed sexually transmitted diseases, partner selection, use of condoms, avoidance of unintended pregnancy and contraceptive alternatives.   -Dental health: Discussed importance of regular tooth brushing, flossing, and dental visits.  -Health maintenance and immunizations reviewed. Please refer to Health maintenance section.  Return to care in 1 year for next preventative visit.   Algis Greenhouse. Jerline Pain, MD 03/11/2017 10:30 AM

## 2017-03-12 LAB — HEPATITIS C ANTIBODY
Hepatitis C Ab: NONREACTIVE
SIGNAL TO CUT-OFF: 0.01 (ref <1.00)

## 2017-03-12 LAB — HIV ANTIBODY (ROUTINE TESTING W REFLEX): HIV 1&2 Ab, 4th Generation: NONREACTIVE

## 2017-04-30 ENCOUNTER — Encounter: Payer: Self-pay | Admitting: Family Medicine

## 2017-07-15 IMAGING — RF DG HIP (WITH PELVIS) OPERATIVE*L*
1 series · 2 of 2 positions shown · non-contrast
Comparison: MRI LEFT hip 11/18/2014

CLINICAL DATA: LEFT anterior total hip arthroplasty

EXAM:
OPERATIVE LEFT HIP (WITH PELVIS IF PERFORMED) 2 VIEWS
TECHNIQUE: Fluoroscopic spot image(s) were submitted for interpretation
post-operatively.
FLUOROSCOPY TIME:  0 minutes 34 seconds
Images obtained:  2

[Series 1: run · 2 of 2 slices shown]
[im 1/2]
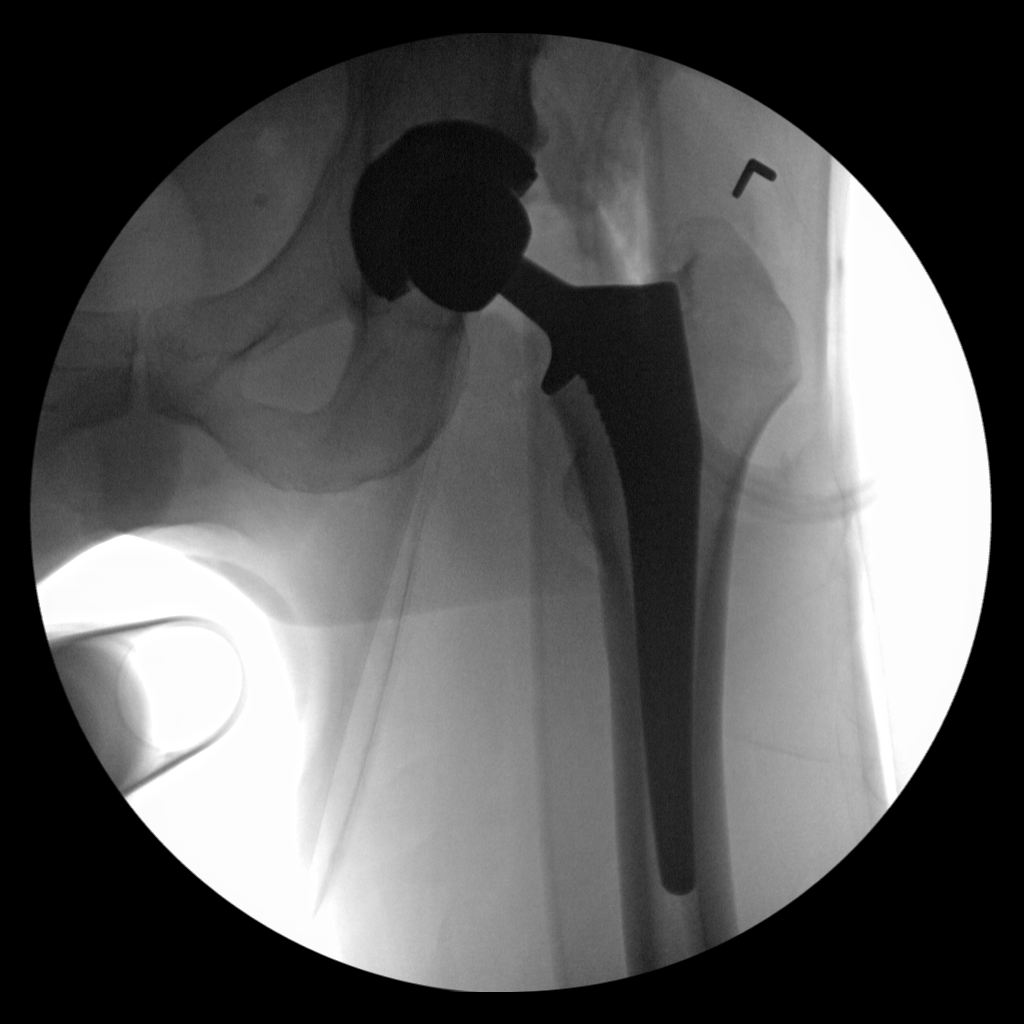
[im 2/2]
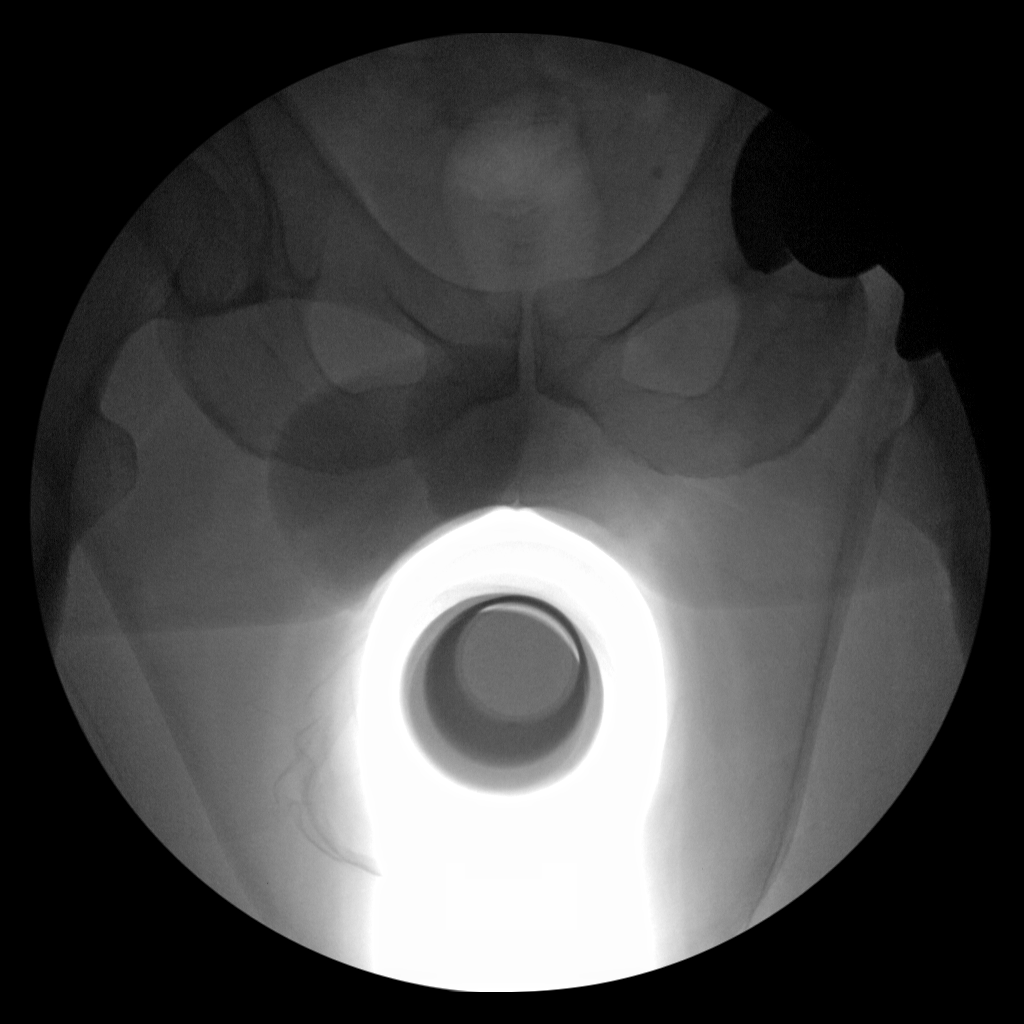

[2 of 2 positions shown; findings below may reference images not displayed]

FINDINGS: LEFT hip prosthesis identified in expected position.

No fracture or dislocation seen.

Bones appear demineralized.
IMPRESSION: LEFT hip prosthesis without acute bony abnormality.

## 2017-09-08 ENCOUNTER — Other Ambulatory Visit: Payer: Self-pay

## 2017-09-08 ENCOUNTER — Ambulatory Visit (AMBULATORY_SURGERY_CENTER): Payer: Self-pay | Admitting: *Deleted

## 2017-09-08 VITALS — Ht 70.0 in | Wt 184.0 lb

## 2017-09-08 DIAGNOSIS — Z86010 Personal history of colonic polyps: Secondary | ICD-10-CM

## 2017-09-08 DIAGNOSIS — Z800 Family history of malignant neoplasm of digestive organs: Secondary | ICD-10-CM

## 2017-09-08 MED ORDER — NA SULFATE-K SULFATE-MG SULF 17.5-3.13-1.6 GM/177ML PO SOLN: 1.0000 | Freq: Once | ORAL | 0 refills | Status: AC

## 2017-09-08 NOTE — Progress Notes (Signed)
No egg or soy allergy known to patient  No issues with past sedation with any surgeries  or procedures, no intubation problems  No diet pills per patient No home 02 use per patient  No blood thinners per patient  Pt denies issues with constipation  No A fib or A flutter  EMMI video sent to pt's e mail  - pt declined Pt given $15 coupon for Suprep in PV today

## 2017-09-29 ENCOUNTER — Encounter: Payer: BLUE CROSS/BLUE SHIELD | Admitting: Internal Medicine

## 2017-12-02 ENCOUNTER — Encounter: Payer: Self-pay | Admitting: Internal Medicine

## 2017-12-02 ENCOUNTER — Other Ambulatory Visit: Payer: Self-pay

## 2017-12-02 ENCOUNTER — Ambulatory Visit (AMBULATORY_SURGERY_CENTER): Payer: Medicare Other | Admitting: Internal Medicine

## 2017-12-02 VITALS — BP 116/71 | HR 45 | Temp 96.6°F | Resp 15 | Ht 70.0 in | Wt 184.0 lb

## 2017-12-02 DIAGNOSIS — D123 Benign neoplasm of transverse colon: Secondary | ICD-10-CM | POA: Diagnosis not present

## 2017-12-02 DIAGNOSIS — Z86010 Personal history of colonic polyps: Secondary | ICD-10-CM | POA: Diagnosis present

## 2017-12-02 MED ORDER — SODIUM CHLORIDE 0.9 % IV SOLN: 500.0000 mL | Freq: Once | INTRAVENOUS | Status: DC

## 2017-12-02 NOTE — Patient Instructions (Addendum)
YOU HAD AN ENDOSCOPIC PROCEDURE TODAY AT [facility]:   Refer to the procedure report that was given to you for any specific questions about what was found during the examination.  If the procedure report does not answer your questions, please call your gastroenterologist to clarify.  If you requested that your care partner not be given the details of your procedure findings, then the procedure report has been included in a sealed envelope for you to review at your convenience later.  YOU SHOULD EXPECT: Some feelings of bloating in the abdomen. Passage of more gas than usual.  Walking can help get rid of the air that was put into your GI tract during the procedure and reduce the bloating. If you had a lower endoscopy (such as a colonoscopy or flexible sigmoidoscopy) you may notice spotting of blood in your stool or on the toilet paper. If you underwent a bowel prep for your procedure, you may not have a normal bowel movement for a few days.  Please Note:  You might notice some irritation and congestion in your nose or some drainage.  This is from the oxygen used during your procedure.  There is no need for concern and it should clear up in a day or so.  SYMPTOMS TO REPORT IMMEDIATELY:   Following lower endoscopy (colonoscopy or flexible sigmoidoscopy):  Excessive amounts of blood in the stool  Significant tenderness or worsening of abdominal pains  Swelling of the abdomen that is new, acute  Fever of 100F or higher   Following upper endoscopy (EGD)  Vomiting of blood or coffee ground material  New chest pain or pain under the shoulder blades  Painful or persistently difficult swallowing  New shortness of breath  Fever of 100F or higher  Black, tarry-looking stools  For urgent or emergent issues, a gastroenterologist can be reached at any hour by calling [phone].   DIET:  We do recommend a small meal at first, but then you may proceed to your regular diet.  Drink  plenty of fluids but you should avoid alcoholic beverages for 24 hours.  ACTIVITY:  You should plan to take it easy for the rest of today and you should NOT DRIVE or use heavy machinery until tomorrow (because of the sedation medicines used during the test).    FOLLOW UP: Our staff will call the number listed on your records the next business day following your procedure to check on you and address any questions or concerns that you may have regarding the information given to you following your procedure. If we do not reach you, we will leave a message.  However, if you are feeling well and you are not experiencing any problems, there is no need to return our call.  We will assume that you have returned to your regular daily activities without incident.  If any biopsies were taken you will be contacted by phone or by letter within the next 1-3 weeks.  Please call us at [phone] if you have not heard about the biopsies in 3 weeks.    SIGNATURES/CONFIDENTIALITY: You and/or your care partner have signed paperwork which will be entered into your electronic medical record.  These signatures attest to the fact that that the information above on your After Visit Summary has been reviewed and is understood.  Full responsibility of the confidentiality of this discharge information lies with you and/or your care-partner.  YOU HAD AN ENDOSCOPIC PROCEDURE TODAY AT [facility]:  Refer to the procedure report that was given to you for any specific questions about what was found during the examination.  If the procedure report does not answer your questions, please call your gastroenterologist to clarify.  If you requested that your care partner not be given the details of your procedure findings, then the procedure report has been included in a sealed envelope for you to review at your convenience later.  YOU SHOULD EXPECT: Some feelings of bloating in the abdomen. Passage of more gas than  usual.  Walking can help get rid of the air that was put into your GI tract during the procedure and reduce the bloating. If you had a lower endoscopy (such as a colonoscopy or flexible sigmoidoscopy) you may notice spotting of blood in your stool or on the toilet paper. If you underwent a bowel prep for your procedure, you may not have a normal bowel movement for a few days.  Please Note:  You might notice some irritation and congestion in your nose or some drainage.  This is from the oxygen used during your procedure.  There is no need for concern and it should clear up in a day or so.  SYMPTOMS TO REPORT IMMEDIATELY:   Following lower endoscopy (colonoscopy or flexible sigmoidoscopy):  Excessive amounts of blood in the stool  Significant tenderness or worsening of abdominal pains  Swelling of the abdomen that is new, acute  Fever of 100F or higher   Following upper endoscopy (EGD)  Vomiting of blood or coffee ground material  New chest pain or pain under the shoulder blades  Painful or persistently difficult swallowing  New shortness of breath  Fever of 100F or higher  Black, tarry-looking stools  For urgent or emergent issues, a gastroenterologist can be reached at any hour by calling [phone].   DIET:  We do recommend a small meal at first, but then you may proceed to your regular diet.  Drink plenty of fluids but you should avoid alcoholic beverages for 24 hours.  ACTIVITY:  You should plan to take it easy for the rest of today and you should NOT DRIVE or use heavy machinery until tomorrow (because of the sedation medicines used during the test).    FOLLOW UP: Our staff will call the number listed on your records the next business day following your procedure to check on you and address any questions or concerns that you may have regarding the information given to you following your procedure. If we do not reach you, we will leave a message.  However, if you are feeling  well and you are not experiencing any problems, there is no need to return our call.  We will assume that you have returned to your regular daily activities without incident.  If any biopsies were taken you will be contacted by phone or by letter within the next 1-3 weeks.  Please call us at [phone] if you have not heard about the biopsies in 3 weeks.    SIGNATURES/CONFIDENTIALITY: You and/or your care partner have signed paperwork which will be entered into your electronic medical record.  These signatures attest to the fact that that the information above on your After Visit Summary has been reviewed and is understood.  Full responsibility of the confidentiality of this discharge information lies with you and/or your care-partner.  Polyp and hemorrhoid information given.YOU HAD AN ENDOSCOPIC PROCEDURE TODAY AT THE [facility]:   Refer to the procedure report that  was given to you for any specific questions about what was found during the examination.  If the procedure report does not answer your questions, please call your gastroenterologist to clarify.  If you requested that your care partner not be given the details of your procedure findings, then the procedure report has been included in a sealed envelope for you to review at your convenience later.  YOU SHOULD EXPECT: Some feelings of bloating in the abdomen. Passage of more gas than usual.  Walking can help get rid of the air that was put into your GI tract during the procedure and reduce the bloating. If you had a lower endoscopy (such as a colonoscopy or flexible sigmoidoscopy) you may notice spotting of blood in your stool or on the toilet paper. If you underwent a bowel prep for your procedure, you may not have a normal bowel movement for a few days.  Please Note:  You might notice some irritation and congestion in your nose or some drainage.  This is from the oxygen used during your procedure.  There is no need for  concern and it should clear up in a day or so.  SYMPTOMS TO REPORT IMMEDIATELY:   Following lower endoscopy (colonoscopy or flexible sigmoidoscopy):  Excessive amounts of blood in the stool  Significant tenderness or worsening of abdominal pains  Swelling of the abdomen that is new, acute  Fever of 100F or higher   For urgent or emergent issues, a gastroenterologist can be reached at any hour by calling [phone].   DIET:  We do recommend a small meal at first, but then you may proceed to your regular diet.  Drink plenty of fluids but you should avoid alcoholic beverages for 24 hours.  ACTIVITY:  You should plan to take it easy for the rest of today and you should NOT DRIVE or use heavy machinery until tomorrow (because of the sedation medicines used during the test).    FOLLOW UP: Our staff will call the number listed on your records the next business day following your procedure to check on you and address any questions or concerns that you may have regarding the information given to you following your procedure. If we do not reach you, we will leave a message.  However, if you are feeling well and you are not experiencing any problems, there is no need to return our call.  We will assume that you have returned to your regular daily activities without incident.  If any biopsies were taken you will be contacted by phone or by letter within the next 1-3 weeks.  Please call us at [phone] if you have not heard about the biopsies in 3 weeks.    SIGNATURES/CONFIDENTIALITY: You and/or your care partner have signed paperwork which will be entered into your electronic medical record.  These signatures attest to the fact that that the information YOU HAD AN ENDOSCOPIC PROCEDURE TODAY AT [address]:   Refer to the procedure report that was given to you for any specific questions about what was found during the examination.  If the procedure report does not answer your  questions, please call your gastroenterologist to clarify.  If you requested that your care partner not be given the details of your procedure findings, then the procedure report has been included in a sealed envelope for you to review at your convenience later.  YOU SHOULD EXPECT: Some feelings of bloating in the abdomen. Passage of more gas than  usual.  Walking can help get rid of the air that was put into your GI tract during the procedure and reduce the bloating. If you had a lower endoscopy (such as a colonoscopy or flexible sigmoidoscopy) you may notice spotting of blood in your stool or on the toilet paper. If you underwent a bowel prep for your procedure, you may not have a normal bowel movement for a few days.  Please Note:  You might notice some irritation and congestion in your nose or some drainage.  This is from the oxygen used during your procedure.  There is no need for concern and it should clear up in a day or so.  SYMPTOMS TO REPORT IMMEDIATELY:   Following lower endoscopy (colonoscopy or flexible sigmoidoscopy):  Excessive amounts of blood in the stool  Significant tenderness or worsening of abdominal pains  Swelling of the abdomen that is new, acute  Fever of 100F or higher   Following upper endoscopy (EGD)  Vomiting of blood or coffee ground material  New chest pain or pain under the shoulder blades  Painful or persistently difficult swallowing  New shortness of breath  Fever of 100F or higher  Black, tarry-looking stools  For urgent or emergent issues, a gastroenterologist can be reached at any hour by calling [phone].   DIET:  We do recommend a small meal at first, but then you may proceed to your regular diet.  Drink plenty of fluids but you should avoid alcoholic beverages for 24 hours.  ACTIVITY:  You should plan to take it easy for the rest of today and you should NOT DRIVE or use heavy machinery until tomorrow (because of the sedation medicines used  during the test).    FOLLOW UP: Our staff will call the number listed on your records the next business day following your procedure to check on you and address any questions or concerns that you may have regarding the information given to you following your procedure. If we do not reach you, we will leave a message.  However, if you are feeling well and you are not experiencing any problems, there is no need to return our call.  We will assume that you have returned to your regular daily activities without incident.  If any biopsies were taken you will be contacted by phone or by letter within the next 1-3 weeks.  Please call us at [phone] if you have not heard about the biopsies in 3 weeks.    SIGNATURES/CONFIDENTIALITY: You and/or your care partner have signed paperwork which will be entered into your electronic medical record.  These signatures attest to the fact that that the information above on your After Visit Summary has been reviewed and is understood.  Full responsibility of the confidentiality of this discharge information lies with you and/or your care-partner.

## 2017-12-02 NOTE — Op Note (Signed)
[place] Patient Name: Matthew Fleming Procedure Date: 12/02/2017 10:18 AM MRN: [id number] Endoscopist: Docia Chuck. Henrene Pastor , MD Age: 67 Referring MD:  Date of Birth: 1950/08/24 Gender: Male Account #: [id number] Procedure:                Colonoscopy with cold snare polypectomy x 1 Indications:              High risk colon cancer surveillance: Personal                            history of non-advanced adenoma. Prior exams                            2000,2004,2008,2014. Father with CRC (age 102); 2                            maternal cousins with CRC. Medicines:                Monitored Anesthesia Care Procedure:                Pre-Anesthesia Assessment:                           - Prior to the procedure, a History and Physical                            was performed, and patient medications and                            allergies were reviewed. The patient's tolerance of                            previous anesthesia was also reviewed. The risks                            and benefits of the procedure and the sedation                            options and risks were discussed with the patient.                            All questions were answered, and informed consent                            was obtained. Prior Anticoagulants: The patient has                            taken no previous anticoagulant or antiplatelet                            agents. ASA Grade Assessment: II - A patient with                            mild systemic disease. After reviewing the risks  and benefits, the patient was deemed in                            satisfactory condition to undergo the procedure.                           After obtaining informed consent, the colonoscope                            was passed under direct vision. Throughout the                            procedure, the patient's blood pressure, pulse, and                            oxygen  saturations were monitored continuously. The                            Colonoscope was introduced through the anus and                            advanced to the the cecum, identified by                            appendiceal orifice and ileocecal valve. The                            ileocecal valve, appendiceal orifice, and rectum                            were photographed. The quality of the bowel                            preparation was good. The colonoscopy was performed                            without difficulty. The patient tolerated the                            procedure well. The bowel preparation used was                            SUPREP. Scope In: 10:22:45 AM Scope Out: 10:37:36 AM Scope Withdrawal Time: 0 hours 10 minutes 45 seconds  Total Procedure Duration: 0 hours 14 minutes 51 seconds  Findings:                 A 1 mm polyp was found in the transverse colon. The                            polyp was removed with a cold snare. Resection and                            retrieval were complete.  Internal hemorrhoids were found during retroflexion.                           The exam was otherwise without abnormality on                            direct and retroflexion views. Complications:            No immediate complications. Estimated blood loss:                            None. Estimated Blood Loss:     Estimated blood loss: none. Impression:               - One 1 mm polyp in the transverse colon, removed                            with a cold snare. Resected and retrieved.                           - Internal hemorrhoids.                           - The examination was otherwise normal on direct                            and retroflexion views. Recommendation:           - Repeat colonoscopy in 5 years for surveillance.                           - Patient has a contact number available for                            emergencies. The  signs and symptoms of potential                            delayed complications were discussed with the                            patient. Return to normal activities tomorrow.                            Written discharge instructions were provided to the                            patient.                           - Resume previous diet.                           - Continue present medications.                           - Await pathology results. Docia Chuck. Henrene Pastor, MD 12/02/2017 10:44:21 AM This report has been signed electronically.

## 2017-12-02 NOTE — Progress Notes (Signed)
A and O x3. Report to RN. Tolerated MAC anesthesia well.

## 2017-12-02 NOTE — Progress Notes (Signed)
Called to room to assist during endoscopic procedure.  Patient ID and intended procedure confirmed with present staff. Received instructions for my participation in the procedure from the performing physician.  

## 2017-12-02 NOTE — Progress Notes (Signed)
History reviewed today 

## 2017-12-03 ENCOUNTER — Telehealth: Payer: Self-pay

## 2017-12-03 NOTE — Telephone Encounter (Signed)
  Follow up Call-  Call back number 12/02/2017  Post procedure Call Back phone  # [phone]  Permission to leave phone message Yes  Some recent data might be hidden     Patient questions:  Do you have a fever, pain , or abdominal swelling? No. Pain Score  0 *  Have you tolerated food without any problems? Yes.    Have you been able to return to your normal activities? Yes.    Do you have any questions about your discharge instructions: Diet   No. Medications  No. Follow up visit  No.  Do you have questions or concerns about your Care? No.  Actions: * If pain score is 4 or above: No action needed, pain <4.  No problems noted per pt. maw

## 2017-12-04 ENCOUNTER — Encounter: Payer: Self-pay | Admitting: Internal Medicine

## 2018-02-26 ENCOUNTER — Telehealth: Payer: Self-pay | Admitting: Family Medicine

## 2018-02-26 NOTE — Telephone Encounter (Signed)
[place] with me. Would recommend pt come in for his annual physical soon.  Algis Greenhouse. Jerline Pain, MD 02/26/2018 5:14 PM

## 2018-02-26 NOTE — Telephone Encounter (Signed)
Please advise 

## 2018-02-26 NOTE — Telephone Encounter (Signed)
See note

## 2018-02-26 NOTE — Telephone Encounter (Signed)
Copied from [place] [id number]. Topic: General - Other >> Feb 26, 2018  2:40 PM Keene Breath wrote: Reason for CRM: Patient called to request a blood test to test his PSA levels.  Patient stated that he had moved and saw a doctor that said his prostate was a little enlarged.  Patient would like the blood work done by Dr. Marigene Ehlers office.  Please advise.  CB# [phone]

## 2018-03-01 ENCOUNTER — Other Ambulatory Visit: Payer: Self-pay

## 2018-03-01 DIAGNOSIS — R972 Elevated prostate specific antigen [PSA]: Secondary | ICD-10-CM

## 2018-03-02 NOTE — Telephone Encounter (Signed)
Order placed and patient scheduled for lab appointment.

## 2018-03-04 ENCOUNTER — Other Ambulatory Visit (INDEPENDENT_AMBULATORY_CARE_PROVIDER_SITE_OTHER): Payer: Medicare Other

## 2018-03-04 DIAGNOSIS — R972 Elevated prostate specific antigen [PSA]: Secondary | ICD-10-CM

## 2018-03-04 LAB — PSA: PSA: 0.39 ng/mL (ref 0.10–4.00)

## 2018-03-05 NOTE — Progress Notes (Signed)
Dr Marigene Ehlers interpretation of your lab work:  Your PSA level is NORMAL. Please come in for your annual physical soon.    If you have any additional questions, please give [place] a call or send [place] a message through [place].  Take care, Dr Jerline Pain

## 2022-02-17 ENCOUNTER — Encounter: Payer: Self-pay | Admitting: *Deleted

## 2022-11-06 ENCOUNTER — Encounter: Payer: Self-pay | Admitting: Internal Medicine

## 2023-06-10 ENCOUNTER — Ambulatory Visit: Payer: Medicare Other | Admitting: Orthopaedic Surgery

## 2023-06-10 ENCOUNTER — Other Ambulatory Visit (INDEPENDENT_AMBULATORY_CARE_PROVIDER_SITE_OTHER): Payer: Medicare Other

## 2023-06-10 ENCOUNTER — Encounter: Payer: Self-pay | Admitting: Orthopaedic Surgery

## 2023-06-10 DIAGNOSIS — M25551 Pain in right hip: Secondary | ICD-10-CM

## 2023-06-10 DIAGNOSIS — M1611 Unilateral primary osteoarthritis, right hip: Secondary | ICD-10-CM | POA: Diagnosis not present

## 2023-06-10 NOTE — Progress Notes (Signed)
 Office Visit Note   Patient: Matthew Fleming           Date of Birth: 09/24/1950           MRN: [id number] Visit Date: 06/10/2023              Requested by: No referring provider defined for this encounter. PCP: No primary care provider on file.   Assessment & Plan: Visit Diagnoses:  1. Pain in right hip   2. Unilateral primary osteoarthritis, right hip     Plan: At this point he has developed significant end-stage arthritis of his right hip and is in need of hip replacement surgery.  Given the debilitating fact his arthritis is having on his mobility, his quality of life and his actives daily living, I agree that he needs to have this done.  His x-rays show bone-on-bone wear of the right hip and complete loss of joint space.  At this point physical therapy and steroid injections will be an effective given the significant amount of arthritis he has in that right hip.  Having had hip replacement surgery before he is fully aware of the risk and benefits of the surgery and what to expect from an intraoperative and postoperative standpoint.  He does wish to proceed with surgery so we will work on getting him scheduled and be in touch.  Follow-Up Instructions: Return for 2 weeks post-op.   Orders:  Orders Placed This Encounter  Procedures   XR HIP UNILAT W OR W/O PELVIS 1V RIGHT   No orders of the defined types were placed in this encounter.     Procedures: No procedures performed   Clinical Data: No additional findings.   Subjective: Chief Complaint  Patient presents with   Right Hip - Pain  The patient is actually well-known to us  even though we have not seen him for a very long period of time.  We replaced his left hip in March 2017 secondary to severe arthritis.  He is now 73 years old and still very active.  He is thin.  He is not a diabetic and not a smoker.  He has no significant active medical issues at all.  Although he lives down at [facility], he and his wife  still come to [place] for healthcare.  He is coming in today due to significant right hip pain that is worsened over the last year and the last 3 months has been significantly painful in terms of a sharp pain in the groin on the right side.  He says his left hip is doing well.  HPI  Review of Systems There is currently listed no headache, chest pain, shortness of breath, fever, chills, nausea, vomiting  Objective: Vital Signs: There were no vitals taken for this visit.  Physical Exam He is alert and orient x 3 and in no acute distress Ortho Exam His left hip replacement and left hip moves smoothly and fluidly with no blocks or rotation and no pain in the groin.  The right hip is significantly stiff with internal and external rotation and significant pain in the groin with rotation. Specialty Comments:  No specialty comments available.  Imaging: XR HIP UNILAT W OR W/O PELVIS 1V RIGHT Result Date: 06/10/2023 An AP pelvis and lateral right hip shows significant bone-on-bone arthritis of the superior lateral joint space and weightbearing surface of the right hip.  There is also sclerotic changes and osteophytes.  This is significantly worsened when compared to  films from 7 years ago when the hip joint space was normal.  There is also a well-seated left total hip arthroplasty.    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/07/2015   Internal hemorrhoids 03/05/2010   Constipation 03/05/2010   RECTAL BLEEDING 03/05/2010   Past Medical History:  Diagnosis Date   Allergy    seasonal   Arthritis    "left hip" (08/07/2015)   Complication of anesthesia 1996   Difficulty to awaken   CONSTIPATION 03/05/2010   Qualifier: Diagnosis of  By: Daphane Dynes NP, Paula     Heart murmur    at birth but has outgrown    HEMORRHOIDS-INTERNAL 03/05/2010   Qualifier: Diagnosis of  By: Daphane Dynes NP, Maureen Sour     RECTAL BLEEDING 03/05/2010   Qualifier: Diagnosis of  By: Daphane Dynes NP, Maureen Sour      Family History  Problem Relation Age of Onset   Pancreatic cancer Mother    Hypertension Mother    Colon cancer Father 56   Heart disease Father    Heart attack Father        31 HAD 4 MI   Colon cancer Cousin    Colon polyps Neg Hx    Esophageal cancer Neg Hx    Rectal cancer Neg Hx    Stomach cancer Neg Hx     Past Surgical History:  Procedure Laterality Date   COLONOSCOPY     COLONOSCOPY W/ POLYPECTOMY     POLYPECTOMY     REFRACTIVE SURGERY Bilateral    SHOULDER ARTHROSCOPY W/ ROTATOR CUFF REPAIR Bilateral 1996 , 2012   SIGMOIDOSCOPY     TOTAL HIP ARTHROPLASTY Left 08/07/2015   Procedure: LEFT TOTAL HIP ARTHROPLASTY ANTERIOR APPROACH;  Surgeon: Arnie Lao, MD;  Location: [facility] OR;  Service: Orthopedics;  Laterality: Left;   UPPER GASTROINTESTINAL ENDOSCOPY     Social History   Occupational History   Occupation: Charity fundraiser    Occupation: Retired  Tobacco Use   Smoking status: Never   Smokeless tobacco: Never  Vaping Use   Vaping status: Never Used  Substance and Sexual Activity   Alcohol use: Yes    Alcohol/week: 8.0 standard drinks of alcohol    Types: 8 Cans of beer per week   Drug use: No   Sexual activity: Yes

## 2023-06-23 ENCOUNTER — Encounter: Payer: Self-pay | Admitting: Internal Medicine

## 2023-07-04 NOTE — Progress Notes (Addendum)
 COVID Vaccine received:  []  No [x]  Yes Date of any COVID positive Test in last 90 days:  none  PCP - Dr. Colen Daunt at [facility]  [phone] Cardiologist -   none  Chest x-Durio - 07-17-2014  2v  Epic EKG -  06-26-2015  Epic Stress Test -  ECHO -  Cardiac Cath -  CT Coronary calcium score-  3.5 on 06-27-2015  PCR screen: [x]  Ordered & Completed []   No Order but Needs PROFEND     []   N/A for this surgery  Surgery Plan:  []  Ambulatory   [x]  Outpatient in bed  []  Admit Anesthesia:    []  General  [x]  Spinal  []   Choice []   MAC  Pacemaker / ICD device [x]  No []  Yes   Spinal Cord Stimulator:[x]  No []  Yes       History of Sleep Apnea? [x]  No []  Yes   CPAP used?- [x]  No []  Yes    Does the patient monitor blood sugar?   [x]  N/A   []  No []  Yes  Patient has: [x]  NO Hx DM   []  Pre-DM   []  DM1  []   DM2  Blood Thinner / Instructions:  none Aspirin  Instructions:  none  ERAS Protocol Ordered: []  No  [x]  Yes PRE-SURGERY [x]  ENSURE  []  G2  Patient is to be NPO after: 09:45   Dental hx: []  Dentures:  [x]  N/A      []  Bridge or Partial:                   []  Loose or Damaged teeth:   Comments: Patient was given the 5 CHG shower / bath instructions for THA surgery along with 2 bottles of the CHG soap. Patient will start this on: 07-20-2023 All questions were asked and answered, Patient voiced understanding of this process.   Activity level: Patient is able to climb flight of stairs without difficulty; [x]  No CP  [x]  No SOB, but would have Leg pain.  Patient can perform ADLs without assistance.   Anesthesia review: Remote hx of murmur, "Hard to wake up" just 1 surgery, HTN- no meds  Patient[place] here anyway for his wife's cancer appt. Jay Meth. said his T&S will be done DOS: Thomasina Fletcher is aware.   Patient denies shortness of breath, fever, cough and chest pain at PAT appointment.  Patient verbalized understanding  and agreement to the Pre-Surgical Instructions that were given to them at this PAT appointment. Patient was also educated of the need to review these PAT instructions again prior to his surgery.I reviewed the appropriate phone numbers to call if they have any and questions or concerns.

## 2023-07-04 NOTE — Patient Instructions (Addendum)
 SURGICAL WAITING ROOM VISITATION Patients having surgery or a procedure may have no more than 2 support people in the waiting area - these visitors may rotate in the visitor waiting room.   Due to an increase in RSV and influenza rates and associated hospitalizations, children ages 49 and under may not visit patients in [facility] hospitals. If the patient needs to stay at the hospital during part of their recovery, the visitor guidelines for inpatient rooms apply.  PRE-OP VISITATION  Pre-op nurse will coordinate an appropriate time for 1 support person to accompany the patient in pre-op.  This support person may not rotate.  This visitor will be contacted when the time is appropriate for the visitor to come back in the pre-op area.  Please refer to the [facility] website for the visitor guidelines for Inpatients (after your surgery is over and you are in a regular room).  You are not required to quarantine at this time prior to your surgery. However, you must do this: Hand Hygiene often Do NOT share personal items Notify your provider if you are in close contact with someone who has COVID or you develop fever 100.4 or greater, new onset of sneezing, cough, sore throat, shortness of breath or body aches.  If you test positive for Covid or have been in contact with anyone that has tested positive in the last 10 days please notify you surgeon.    Your procedure is scheduled on:  FRIDAY  July 24, 2023  Report to [facility] Main Entrance: [facility] entrance where the [employer] is available.   Report to admitting at:  10:15   AM  Call this number if you have any questions or problems the morning of surgery [phone]  Do not eat food after Midnight the night prior to your surgery/procedure.  After Midnight you may have the following liquids until   09:45  AM DAY OF SURGERY  Clear Liquid Diet Water  Black Coffee (sugar ok, NO MILK/CREAM OR CREAMERS)  Tea (sugar ok, NO  MILK/CREAM OR CREAMERS) regular and decaf                             Plain Jell-O  with no fruit (NO RED)                                           Fruit ices (not with fruit pulp, NO RED)                                     Popsicles (NO RED)                                                                  Juice: NO CITRUS JUICES: only apple, WHITE grape, WHITE cranberry Sports drinks like Gatorade or Powerade (NO RED)                   The day of surgery:  Drink ONE (1) Pre-Surgery Clear Ensure at  09:45 AM the morning of  surgery. Drink in one sitting. Do not sip.  This drink was given to you during your hospital pre-op appointment visit. Nothing else to drink after completing the Pre-Surgery Clear Ensure: No candy, chewing gum or throat lozenges.    FOLLOW ANY ADDITIONAL PRE OP INSTRUCTIONS YOU RECEIVED FROM YOUR SURGEON'S OFFICE!!!   Oral Hygiene is also important to reduce your risk of infection.        Remember - BRUSH YOUR TEETH THE MORNING OF SURGERY WITH YOUR REGULAR TOOTHPASTE  Do NOT smoke after Midnight the night before surgery.  STOP TAKING all Vitamins, Herbs and supplements 1 week before your surgery.   Take ONLY these medicines the morning of surgery with A SIP OF WATER :  None                   You may not have any metal on your body including jewelry, and body piercing  Do not wear  lotions, powders, cologne, or deodorant  Men may shave face and neck.  Contacts, Hearing Aids, dentures or bridgework may not be worn into surgery. DENTURES WILL BE REMOVED PRIOR TO SURGERY PLEASE DO NOT APPLY "Poly grip" OR ADHESIVES!!!  You may bring a small overnight bag with you on the day of surgery, only pack items that are not valuable. [facility] IS NOT RESPONSIBLE   FOR VALUABLES THAT ARE LOST OR STOLEN.   Do not bring your home medications to the hospital. The Pharmacy will dispense medications listed on your medication list to you during your admission in the  Hospital.  Special Instructions: Bring a copy of your healthcare power of attorney and living will documents the day of surgery, if you wish to have them scanned into your [facility] Records- EPIC  Please read over the following fact sheets you were given: IF YOU HAVE QUESTIONS ABOUT YOUR PRE-OP INSTRUCTIONS, PLEASE CALL [phone].     Pre-operative 5 CHG Bath Instructions   You can play a key role in reducing the risk of infection after surgery. Your skin needs to be as free of germs as possible. You can reduce the number of germs on your skin by washing with CHG (chlorhexidine  gluconate) soap before surgery. CHG is an antiseptic soap that kills germs and continues to kill germs even after washing.   DO NOT use if you have an allergy to chlorhexidine /CHG or antibacterial soaps. If your skin becomes reddened or irritated, stop using the CHG and notify one of our RNs at [phone]  Please shower with the CHG soap starting 4 days before surgery using the following schedule: START SHOWERS ON   MONDAY  July 20, 2023  Please keep in mind the following:  DO NOT shave, including legs and underarms, starting the day of your first shower.   You may shave your face at any point before/day of surgery.   Place clean sheets on your bed the day you start using CHG soap. Use a clean washcloth (not used since being washed) for each shower. DO NOT sleep with pets once you start using the CHG.   CHG Shower Instructions:  If you choose to wash your hair and private area, wash first with your normal shampoo/soap.  After you use shampoo/soap, rinse your hair and body thoroughly to remove shampoo/soap residue.  Turn the water  OFF and apply about 3 tablespoons (45 ml) of CHG soap to a CLEAN washcloth.  Apply CHG soap ONLY  FROM YOUR NECK DOWN TO YOUR TOES (washing for 3-5 minutes)  DO NOT use CHG soap on face, private areas, open wounds, or sores.  Pay special attention to the area where your surgery is being performed.  If you are having back surgery, having someone wash your back for you may be helpful.  Wait 2 minutes after CHG soap is applied, then you may rinse off the CHG soap.  Pat dry with a clean towel  Put on clean clothes/pajamas   If you choose to wear lotion, please use ONLY the CHG-compatible lotions on the back of this paper.     Additional instructions for the day of surgery: DO NOT APPLY any lotions, deodorants, cologne, or perfumes.   Put on clean/comfortable clothes.  Brush your teeth.  Ask your nurse before applying any prescription medications to the skin.      CHG Compatible Lotions   Aveeno Moisturizing lotion  Cetaphil Moisturizing Cream  Cetaphil Moisturizing Lotion  Clairol Herbal Essence Moisturizing Lotion, Dry Skin  Clairol Herbal Essence Moisturizing Lotion, Extra Dry Skin  Clairol Herbal Essence Moisturizing Lotion, Normal Skin  Curel Age Defying Therapeutic Moisturizing Lotion with Alpha Hydroxy  Curel Extreme Care Body Lotion  Curel Soothing Hands Moisturizing Hand Lotion  Curel Therapeutic Moisturizing Cream, Fragrance-Free  Curel Therapeutic Moisturizing Lotion, Fragrance-Free  Curel Therapeutic Moisturizing Lotion, Original Formula  Eucerin Daily Replenishing Lotion  Eucerin Dry Skin Therapy Plus Alpha Hydroxy Crme  Eucerin Dry Skin Therapy Plus Alpha Hydroxy Lotion  Eucerin Original Crme  Eucerin Original Lotion  Eucerin Plus Crme Eucerin Plus Lotion  Eucerin TriLipid Replenishing Lotion  Keri Anti-Bacterial Hand Lotion  Keri Deep Conditioning Original Lotion Dry Skin Formula Softly Scented  Keri Deep Conditioning Original Lotion, Fragrance Free Sensitive Skin Formula  Keri Lotion Fast Absorbing Fragrance Free Sensitive Skin Formula  Keri Lotion Fast  Absorbing Softly Scented Dry Skin Formula  Keri Original Lotion  Keri Skin Renewal Lotion Keri Silky Smooth Lotion  Keri Silky Smooth Sensitive Skin Lotion  Nivea Body Creamy Conditioning Oil  Nivea Body Extra Enriched Lotion  Nivea Body Original Lotion  Nivea Body Sheer Moisturizing Lotion Nivea Crme  Nivea Skin Firming Lotion  NutraDerm 30 Skin Lotion  NutraDerm Skin Lotion  NutraDerm Therapeutic Skin Cream  NutraDerm Therapeutic Skin Lotion  ProShield Protective Hand Cream  Provon moisturizing lotion   FAILURE TO FOLLOW THESE INSTRUCTIONS MAY RESULT IN THE CANCELLATION OF YOUR SURGERY  PATIENT SIGNATURE_________________________________  NURSE SIGNATURE__________________________________  ________________________________________________________________________       Matthew Fleming    An incentive spirometer is a tool that can help keep your lungs clear and active. This tool measures how well you are filling your lungs with each breath. Taking  long deep breaths may help reverse or decrease the chance of developing breathing (pulmonary) problems (especially infection) following: A long period of time when you are unable to move or be active. BEFORE THE PROCEDURE  If the spirometer includes an indicator to show your best effort, your nurse or respiratory therapist will set it to a desired goal. If possible, sit up straight or lean slightly forward. Try not to slouch. Hold the incentive spirometer in an upright position. INSTRUCTIONS FOR USE  Sit on the edge of your bed if possible, or sit up as far as you can in bed or on a chair. Hold the incentive spirometer in an upright position. Breathe out normally. Place the mouthpiece in your mouth and seal your lips tightly around it. Breathe in slowly and as deeply as possible, raising the piston or the ball toward the top of the column. Hold your breath for 3-5 seconds or for as long as possible. Allow the piston or ball  to fall to the bottom of the column. Remove the mouthpiece from your mouth and breathe out normally. Rest for a few seconds and repeat Steps 1 through 7 at least 10 times every 1-2 hours when you are awake. Take your time and take a few normal breaths between deep breaths. The spirometer may include an indicator to show your best effort. Use the indicator as a goal to work toward during each repetition. After each set of 10 deep breaths, practice coughing to be sure your lungs are clear. If you have an incision (the cut made at the time of surgery), support your incision when coughing by placing a pillow or rolled up towels firmly against it. Once you are able to get out of bed, walk around indoors and cough well. You may stop using the incentive spirometer when instructed by your caregiver.  RISKS AND COMPLICATIONS Take your time so you do not get dizzy or light-headed. If you are in pain, you may need to take or ask for pain medication before doing incentive spirometry. It is harder to take a deep breath if you are having pain. AFTER USE Rest and breathe slowly and easily. It can be helpful to keep track of a log of your progress. Your caregiver can provide you with a simple table to help with this. If you are using the spirometer at home, follow these instructions: SEEK MEDICAL CARE IF:  You are having difficultly using the spirometer. You have trouble using the spirometer as often as instructed. Your pain medication is not giving enough relief while using the spirometer. You develop fever of 100.5 F (38.1 C) or higher.                                                                                                    SEEK IMMEDIATE MEDICAL CARE IF:  You cough up bloody sputum that had not been present before. You develop fever of 102 F (38.9 C) or greater. You develop worsening pain at or near the incision site. MAKE SURE YOU:  Understand these instructions. Will watch your  condition.  Will get help right away if you are not doing well or get worse. Document Released: 09/22/2006 Document Revised: 08/04/2011 Document Reviewed: 11/23/2006 [facility] Patient Information 2014 [place], [place].      WHAT IS A BLOOD TRANSFUSION? Blood Transfusion Information  A transfusion is the replacement of blood or some of its parts. Blood is made up of multiple cells which provide different functions. Red blood cells carry oxygen and are used for blood loss replacement. White blood cells fight against infection. Platelets control bleeding. Plasma helps clot blood. Other blood products are available for specialized needs, such as hemophilia or other clotting disorders. BEFORE THE TRANSFUSION  Who gives blood for transfusions?  Healthy volunteers who are fully evaluated to make sure their blood is safe. This is blood bank blood. Transfusion therapy is the safest it has ever been in the practice of medicine. Before blood is taken from a donor, a complete history is taken to make sure that person has no history of diseases nor engages in risky social behavior (examples are intravenous drug use or sexual activity with multiple partners). The donor's travel history is screened to minimize risk of transmitting infections, such as malaria. The donated blood is tested for signs of infectious diseases, such as HIV and hepatitis. The blood is then tested to be sure it is compatible with you in order to minimize the chance of a transfusion reaction. If you or a relative donates blood, this is often done in anticipation of surgery and is not appropriate for emergency situations. It takes many days to process the donated blood. RISKS AND COMPLICATIONS Although transfusion therapy is very safe and saves many lives, the main dangers of transfusion include:  Getting an infectious disease. Developing a transfusion reaction. This is an allergic reaction to something in the blood you were given. Every  precaution is taken to prevent this. The decision to have a blood transfusion has been considered carefully by your caregiver before blood is given. Blood is not given unless the benefits outweigh the risks. AFTER THE TRANSFUSION Right after receiving a blood transfusion, you will usually feel much better and more energetic. This is especially true if your red blood cells have gotten low (anemic). The transfusion raises the level of the red blood cells which carry oxygen, and this usually causes an energy increase. The nurse administering the transfusion will monitor you carefully for complications. HOME CARE INSTRUCTIONS  No special instructions are needed after a transfusion. You may find your energy is better. Speak with your caregiver about any limitations on activity for underlying diseases you may have. SEEK MEDICAL CARE IF:  Your condition is not improving after your transfusion. You develop redness or irritation at the intravenous (IV) site. SEEK IMMEDIATE MEDICAL CARE IF:  Any of the following symptoms occur over the next 12 hours: Shaking chills. You have a temperature by mouth above 102 F (38.9 C), not controlled by medicine. Chest, back, or muscle pain. People around you feel you are not acting correctly or are confused. Shortness of breath or difficulty breathing. Dizziness and fainting. You get a rash or develop hives. You have a decrease in urine output. Your urine turns a dark color or changes to pink, red, or brown. Any of the following symptoms occur over the next 10 days: You have a temperature by mouth above 102 F (38.9 C), not controlled by medicine. Shortness of breath. Weakness after normal activity. The white part of the eye turns yellow (jaundice). You have a decrease  in the amount of urine or are urinating less often. Your urine turns a dark color or changes to pink, red, or brown. Document Released: 05/09/2000 Document Revised: 08/04/2011 Document Reviewed:  12/27/2007 [facility] Patient Information 2014 [place], [place].  _______________________________________________________________________     If you would like to see a video about joint replacement:   [email or web address]

## 2023-07-06 ENCOUNTER — Other Ambulatory Visit: Payer: Self-pay

## 2023-07-06 ENCOUNTER — Encounter (HOSPITAL_COMMUNITY): Payer: Self-pay

## 2023-07-06 ENCOUNTER — Encounter (HOSPITAL_COMMUNITY)
Admission: RE | Admit: 2023-07-06 | Discharge: 2023-07-06 | Disposition: A | Discharge status: Home / Self Care | Payer: Medicare Other | Source: Ambulatory Visit | Attending: Orthopaedic Surgery | Admitting: Orthopaedic Surgery

## 2023-07-06 VITALS — BP 154/94 | HR 60 | Temp 97.5°F | Resp 16 | Ht 70.0 in | Wt 170.0 lb

## 2023-07-06 DIAGNOSIS — Z01812 Encounter for preprocedural laboratory examination: Secondary | ICD-10-CM | POA: Diagnosis present

## 2023-07-06 DIAGNOSIS — Z8679 Personal history of other diseases of the circulatory system: Secondary | ICD-10-CM

## 2023-07-06 DIAGNOSIS — M1611 Unilateral primary osteoarthritis, right hip: Secondary | ICD-10-CM

## 2023-07-06 DIAGNOSIS — Z01818 Encounter for other preprocedural examination: Secondary | ICD-10-CM

## 2023-07-06 DIAGNOSIS — Z01810 Encounter for preprocedural cardiovascular examination: Secondary | ICD-10-CM | POA: Diagnosis present

## 2023-07-06 HISTORY — DX: Essential (primary) hypertension: I10

## 2023-07-06 LAB — SURGICAL PCR SCREEN
MRSA, PCR: NEGATIVE
Staphylococcus aureus: NEGATIVE

## 2023-07-06 LAB — BASIC METABOLIC PANEL
Anion gap: 9 (ref 5–15)
BUN: 24 mg/dL — ABNORMAL HIGH (ref 8–23)
CO2: 25 mmol/L (ref 22–32)
Calcium: 9 mg/dL (ref 8.9–10.3)
Chloride: 104 mmol/L (ref 98–111)
Creatinine, Ser: 1.01 mg/dL (ref 0.61–1.24)
GFR, Estimated: >60 mL/min (ref >60)
Glucose, Bld: 90 mg/dL (ref 70–99)
Potassium: 4.2 mmol/L (ref 3.5–5.1)
Sodium: 138 mmol/L (ref 135–145)

## 2023-07-06 LAB — CBC
HCT: 44 % (ref 39.0–52.0)
Hemoglobin: 14.1 g/dL (ref 13.0–17.0)
MCH: 30.2 pg (ref 26.0–34.0)
MCHC: 32 g/dL (ref 30.0–36.0)
MCV: 94.2 fL (ref 80.0–100.0)
Platelets: 245 10*3/uL (ref 150–400)
RBC: 4.67 MIL/uL (ref 4.22–5.81)
RDW: 12.9 % (ref 11.5–15.5)
WBC: 8.1 10*3/uL (ref 4.0–10.5)
nRBC: 0 % (ref 0.0–0.2)

## 2023-07-13 ENCOUNTER — Encounter (HOSPITAL_COMMUNITY): Payer: Medicare Other

## 2023-07-13 ENCOUNTER — Ambulatory Visit (AMBULATORY_SURGERY_CENTER): Payer: Medicare Other

## 2023-07-13 VITALS — Ht 70.0 in | Wt 170.0 lb

## 2023-07-13 DIAGNOSIS — Z860100 Personal history of colon polyps, unspecified: Secondary | ICD-10-CM

## 2023-07-13 MED ORDER — SUFLAVE 178.7 G PO SOLR: 1.0000 | Freq: Once | ORAL | 0 refills | Status: AC

## 2023-07-13 NOTE — Progress Notes (Signed)
 No egg or soy allergy known to patient  No issues known to pt with past sedation with any surgeries or procedures Patient denies ever being told they had issues or difficulty with intubation  No FH of Malignant Hyperthermia Pt is not on diet pills Pt is not on  home 02  Pt is not on blood thinners  Pt denies issues with chronic constipation  No A fib or A flutter Have any cardiac testing pending--no Ambulates independently Patient's chart reviewed by Cathlyn Parsons CNRA prior to previsit and patient appropriate for the [facility].  Previsit completed and red dot placed by patient's name on their procedure day (on provider's schedule).

## 2023-07-23 NOTE — H&P (Signed)
 TOTAL HIP ADMISSION H&P  Patient is admitted for right total hip arthroplasty.  Subjective:  Chief Complaint: right hip pain  HPI: Matthew Fleming, 73 y.o. male, has a history of pain and functional disability in the right hip(s) due to arthritis and patient has failed non-surgical conservative treatments for greater than 12 weeks to include NSAID's and/or analgesics and activity modification.  Onset of symptoms was gradual starting 1 years ago with gradually worsening course since that time.The patient noted no past surgery on the right hip(s).  Patient currently rates pain in the right hip at 10 out of 10 with activity. Patient has night pain, worsening of pain with activity and weight bearing, pain that interfers with activities of daily living, and pain with passive range of motion. Patient has evidence of subchondral sclerosis, periarticular osteophytes, and joint space narrowing by imaging studies. This condition presents safety issues increasing the risk of falls.  There is no current active infection.  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   HEMORRHOIDS-INTERNAL 03/05/2010   Qualifier: Diagnosis of  By: Wilmon Pali NP, Gunnar Fusi     RECTAL BLEEDING 03/05/2010   Qualifier: Diagnosis of  By: Wilmon Pali NP, Gunnar Fusi      Past Surgical History:  Procedure Laterality Date   COLONOSCOPY     COLONOSCOPY W/ POLYPECTOMY     EYE SURGERY Bilateral 2001   Lasik surgery    POLYPECTOMY     SHOULDER ARTHROSCOPY W/ ROTATOR CUFF REPAIR Bilateral 1996 , 2012   SIGMOIDOSCOPY     TOTAL HIP ARTHROPLASTY Left 08/07/2015   Procedure: LEFT TOTAL HIP ARTHROPLASTY ANTERIOR APPROACH;  Surgeon: Kathryne Hitch, MD;  Location: [facility] OR;  Service: Orthopedics;  Laterality: Left;   UPPER GASTROINTESTINAL ENDOSCOPY      No current facility-administered medications for this encounter.   Current Outpatient Medications  Medication Sig Dispense Refill Last Dose/Taking   Aspirin-Acetaminophen-Caffeine (GOODYS EXTRA STRENGTH) 500-325-65 MG PACK Take 1 packet by mouth 2 (two) times daily as needed (pain.).   Taking As Needed   No Known Allergies  Social History   Tobacco Use   Smoking status: Never   Smokeless tobacco: Never  Substance Use Topics   Alcohol use: Yes    Alcohol/week: 8.0 standard drinks of alcohol    Types: 8 Cans of beer per week    Family History  Problem Relation Age of Onset   Pancreatic cancer Mother    Hypertension Mother    Colon cancer Father 38   Heart disease Father    Heart attack Father        18 HAD 4 MI   Colon cancer Cousin    Colon polyps Neg Hx    Esophageal cancer Neg Hx    Rectal cancer Neg Hx    Stomach cancer Neg Hx      Review of Systems  Objective:  Physical Exam Vitals reviewed.  Constitutional:      Appearance: Normal appearance. He is normal weight.  HENT:     Head: Normocephalic and atraumatic.  Eyes:     Extraocular Movements: Extraocular movements intact.     Pupils: Pupils are equal, round, and reactive to light.  Cardiovascular:     Rate and Rhythm: Normal rate and regular rhythm.     Pulses: Normal pulses.  Pulmonary:     Effort: Pulmonary effort is normal.     Breath sounds: Normal breath sounds.  Abdominal:     Palpations: Abdomen is soft.  Musculoskeletal:     Cervical back: Normal range of motion and neck supple.     Right hip: Tenderness and bony tenderness present. Decreased range of  motion. Decreased strength.  Neurological:     Mental Status: He is alert and oriented to person, place, and time.  Psychiatric:        Behavior: Behavior normal.     Vital signs in last 24 hours:    Labs:   Estimated body mass index is 24.39 kg/m as calculated from the following:   Height as of 07/13/23: 5\' 10"  (1.778 m).   Weight as of 07/13/23: 77.1 kg.   Imaging Review Plain radiographs demonstrate severe degenerative joint disease of the right hip(s). The bone quality appears to be excellent for age and reported activity level.      Assessment/Plan:  End stage arthritis, right hip(s)  The patient history, physical examination, clinical judgement of the provider and imaging studies are consistent with end stage degenerative joint disease of the right hip(s) and total hip arthroplasty is deemed medically necessary. The treatment options including medical management, injection therapy, arthroscopy and arthroplasty were discussed at length. The risks and benefits of total hip arthroplasty were presented and reviewed. The risks due to aseptic loosening, infection, stiffness, dislocation/subluxation,  thromboembolic complications and other imponderables were discussed.  The patient acknowledged the explanation, agreed to proceed with the plan and consent was signed. Patient is being admitted for inpatient treatment for surgery, pain control, PT, OT, prophylactic antibiotics, VTE prophylaxis, progressive ambulation and ADL's and discharge planning.The patient is planning to be discharged home with home health services

## 2023-07-24 ENCOUNTER — Ambulatory Visit (HOSPITAL_COMMUNITY): Payer: Medicare Other | Admitting: Anesthesiology

## 2023-07-24 ENCOUNTER — Ambulatory Visit (HOSPITAL_COMMUNITY): Payer: Medicare Other

## 2023-07-24 ENCOUNTER — Encounter (HOSPITAL_COMMUNITY)
Admission: RE | Disposition: A | Discharge status: Home / Self Care | Payer: Self-pay | Source: Ambulatory Visit | Attending: Orthopaedic Surgery

## 2023-07-24 ENCOUNTER — Encounter (HOSPITAL_COMMUNITY): Payer: Self-pay | Admitting: Orthopaedic Surgery

## 2023-07-24 ENCOUNTER — Observation Stay (HOSPITAL_COMMUNITY)
Admission: RE | Admit: 2023-07-24 | Discharge: 2023-07-25 | Disposition: A | Discharge status: Home / Self Care | Payer: Medicare Other | Source: Ambulatory Visit | Attending: Orthopaedic Surgery | Admitting: Orthopaedic Surgery

## 2023-07-24 ENCOUNTER — Other Ambulatory Visit: Payer: Self-pay

## 2023-07-24 ENCOUNTER — Observation Stay (HOSPITAL_COMMUNITY): Payer: Medicare Other

## 2023-07-24 DIAGNOSIS — M1611 Unilateral primary osteoarthritis, right hip: Secondary | ICD-10-CM | POA: Diagnosis not present

## 2023-07-24 DIAGNOSIS — Z96642 Presence of left artificial hip joint: Secondary | ICD-10-CM | POA: Insufficient documentation

## 2023-07-24 DIAGNOSIS — Z96641 Presence of right artificial hip joint: Secondary | ICD-10-CM

## 2023-07-24 HISTORY — PX: TOTAL HIP ARTHROPLASTY: SHX124

## 2023-07-24 LAB — ABO/RH: ABO/RH(D): A POS

## 2023-07-24 LAB — TYPE AND SCREEN
ABO/RH(D): A POS
Antibody Screen: NEGATIVE

## 2023-07-24 SURGERY — ARTHROPLASTY, HIP, TOTAL, ANTERIOR APPROACH
Anesthesia: Monitor Anesthesia Care | Site: Hip | Laterality: Right

## 2023-07-24 MED ORDER — AMISULPRIDE (ANTIEMETIC) 5 MG/2ML IV SOLN: 10.0000 mg | Freq: Once | INTRAVENOUS | Status: DC | PRN

## 2023-07-24 MED ORDER — OXYCODONE HCL 5 MG PO TABS
5.0000 mg | ORAL_TABLET | ORAL | Status: DC | PRN
  Administered 2023-07-24: 10 mg via ORAL
  Administered 2023-07-25: 5 mg via ORAL
  Filled 2023-07-24: qty 1
  Filled 2023-07-24: qty 2

## 2023-07-24 MED ORDER — METHOCARBAMOL 1000 MG/10ML IJ SOLN: 500.0000 mg | Freq: Four times a day (QID) | INTRAMUSCULAR | Status: DC | PRN

## 2023-07-24 MED ORDER — CHLORHEXIDINE GLUCONATE 0.12 % MT SOLN
15.0000 mL | Freq: Once | OROMUCOSAL | Status: AC
  Administered 2023-07-24: 15 mL via OROMUCOSAL

## 2023-07-24 MED ORDER — CEFAZOLIN SODIUM-DEXTROSE 2-4 GM/100ML-% IV SOLN
2.0000 g | INTRAVENOUS | Status: AC
  Administered 2023-07-24: 2 g via INTRAVENOUS
  Filled 2023-07-24: qty 100

## 2023-07-24 MED ORDER — FENTANYL CITRATE (PF) 100 MCG/2ML IJ SOLN
INTRAMUSCULAR | Status: DC | PRN
  Administered 2023-07-24 (×2): 50 ug via INTRAVENOUS

## 2023-07-24 MED ORDER — DEXAMETHASONE SODIUM PHOSPHATE 10 MG/ML IJ SOLN
INTRAMUSCULAR | Status: DC | PRN
  Administered 2023-07-24: 8 mg via INTRAVENOUS

## 2023-07-24 MED ORDER — METOCLOPRAMIDE HCL 5 MG/ML IJ SOLN: 5.0000 mg | Freq: Three times a day (TID) | INTRAMUSCULAR | Status: DC | PRN

## 2023-07-24 MED ORDER — PROPOFOL 500 MG/50ML IV EMUL
INTRAVENOUS | Status: DC | PRN
Start: 2023-07-24 — End: 2023-07-24
  Administered 2023-07-24: 50 ug/kg/min via INTRAVENOUS

## 2023-07-24 MED ORDER — PHENOL 1.4 % MT LIQD: 1.0000 | OROMUCOSAL | Status: DC | PRN

## 2023-07-24 MED ORDER — ORAL CARE MOUTH RINSE: 15.0000 mL | OROMUCOSAL | Status: DC | PRN

## 2023-07-24 MED ORDER — DOCUSATE SODIUM 100 MG PO CAPS
100.0000 mg | ORAL_CAPSULE | Freq: Two times a day (BID) | ORAL | Status: DC
  Administered 2023-07-24 – 2023-07-25 (×2): 100 mg via ORAL
  Filled 2023-07-24 (×2): qty 1

## 2023-07-24 MED ORDER — ALUM & MAG HYDROXIDE-SIMETH 200-200-20 MG/5ML PO SUSP
30.0000 mL | ORAL | Status: DC | PRN
  Administered 2023-07-25: 30 mL via ORAL
  Filled 2023-07-24: qty 30

## 2023-07-24 MED ORDER — ORAL CARE MOUTH RINSE: 15.0000 mL | Freq: Once | OROMUCOSAL | Status: AC

## 2023-07-24 MED ORDER — ONDANSETRON HCL 4 MG PO TABS: 4.0000 mg | ORAL_TABLET | Freq: Four times a day (QID) | ORAL | Status: DC | PRN

## 2023-07-24 MED ORDER — HYDROMORPHONE HCL 1 MG/ML IJ SOLN: 0.2500 mg | INTRAMUSCULAR | Status: DC | PRN

## 2023-07-24 MED ORDER — MIDAZOLAM HCL 5 MG/5ML IJ SOLN
INTRAMUSCULAR | Status: DC | PRN
  Administered 2023-07-24 (×2): 1 mg via INTRAVENOUS

## 2023-07-24 MED ORDER — ACETAMINOPHEN 325 MG PO TABS: 325.0000 mg | ORAL_TABLET | Freq: Four times a day (QID) | ORAL | Status: DC | PRN

## 2023-07-24 MED ORDER — METOCLOPRAMIDE HCL 5 MG PO TABS: 5.0000 mg | ORAL_TABLET | Freq: Three times a day (TID) | ORAL | Status: DC | PRN

## 2023-07-24 MED ORDER — OXYCODONE HCL 5 MG PO TABS
10.0000 mg | ORAL_TABLET | ORAL | Status: DC | PRN
Start: 2023-07-24 — End: 2023-07-25
  Administered 2023-07-25 (×3): 15 mg via ORAL
  Filled 2023-07-24 (×4): qty 3
  Filled 2023-07-24: qty 2

## 2023-07-24 MED ORDER — HYDROMORPHONE HCL 1 MG/ML IJ SOLN
0.5000 mg | INTRAMUSCULAR | Status: DC | PRN
  Administered 2023-07-24 (×2): 0.5 mg via INTRAVENOUS
  Filled 2023-07-24 (×2): qty 1

## 2023-07-24 MED ORDER — ACETAMINOPHEN 500 MG PO TABS
1000.0000 mg | ORAL_TABLET | Freq: Once | ORAL | Status: DC
  Filled 2023-07-24: qty 2

## 2023-07-24 MED ORDER — 0.9 % SODIUM CHLORIDE (POUR BTL) OPTIME
TOPICAL | Status: DC | PRN
  Administered 2023-07-24: 1000 mL

## 2023-07-24 MED ORDER — CEFAZOLIN SODIUM-DEXTROSE 2-4 GM/100ML-% IV SOLN
2.0000 g | Freq: Four times a day (QID) | INTRAVENOUS | Status: AC
  Administered 2023-07-24 – 2023-07-25 (×2): 2 g via INTRAVENOUS
  Filled 2023-07-24 (×2): qty 100

## 2023-07-24 MED ORDER — ONDANSETRON HCL 4 MG/2ML IJ SOLN
INTRAMUSCULAR | Status: DC | PRN
  Administered 2023-07-24: 4 mg via INTRAVENOUS

## 2023-07-24 MED ORDER — MIDAZOLAM HCL 2 MG/2ML IJ SOLN
INTRAMUSCULAR | Status: AC
  Filled 2023-07-24: qty 2

## 2023-07-24 MED ORDER — LACTATED RINGERS IV SOLN: INTRAVENOUS | Status: DC

## 2023-07-24 MED ORDER — OXYCODONE HCL 5 MG PO TABS
ORAL_TABLET | ORAL | Status: AC
  Filled 2023-07-24: qty 1

## 2023-07-24 MED ORDER — METHOCARBAMOL 500 MG PO TABS
500.0000 mg | ORAL_TABLET | Freq: Four times a day (QID) | ORAL | Status: DC | PRN
  Administered 2023-07-24 – 2023-07-25 (×2): 500 mg via ORAL
  Filled 2023-07-24 (×2): qty 1

## 2023-07-24 MED ORDER — LIDOCAINE HCL (CARDIAC) PF 100 MG/5ML IV SOSY
PREFILLED_SYRINGE | INTRAVENOUS | Status: DC | PRN
  Administered 2023-07-24: 20 mg via INTRAVENOUS

## 2023-07-24 MED ORDER — OXYCODONE HCL 5 MG PO TABS
5.0000 mg | ORAL_TABLET | Freq: Once | ORAL | Status: AC | PRN
  Administered 2023-07-24: 5 mg via ORAL

## 2023-07-24 MED ORDER — OXYCODONE HCL 5 MG/5ML PO SOLN: 5.0000 mg | Freq: Once | ORAL | Status: AC | PRN

## 2023-07-24 MED ORDER — PHENYLEPHRINE HCL-NACL 20-0.9 MG/250ML-% IV SOLN
INTRAVENOUS | Status: AC
  Filled 2023-07-24: qty 250

## 2023-07-24 MED ORDER — TRANEXAMIC ACID-NACL 1000-0.7 MG/100ML-% IV SOLN
1000.0000 mg | INTRAVENOUS | Status: AC
  Administered 2023-07-24: 1000 mg via INTRAVENOUS
  Filled 2023-07-24: qty 100

## 2023-07-24 MED ORDER — POVIDONE-IODINE 10 % EX SWAB
2.0000 | Freq: Once | CUTANEOUS | Status: AC
  Administered 2023-07-24: 2 via TOPICAL

## 2023-07-24 MED ORDER — SODIUM CHLORIDE 0.9 % IR SOLN
Status: DC | PRN
  Administered 2023-07-24: 1000 mL

## 2023-07-24 MED ORDER — ONDANSETRON HCL 4 MG/2ML IJ SOLN: 4.0000 mg | Freq: Once | INTRAMUSCULAR | Status: DC | PRN

## 2023-07-24 MED ORDER — STERILE WATER FOR IRRIGATION IR SOLN
Status: DC | PRN
  Administered 2023-07-24: 1000 mL

## 2023-07-24 MED ORDER — ONDANSETRON HCL 4 MG/2ML IJ SOLN: 4.0000 mg | Freq: Four times a day (QID) | INTRAMUSCULAR | Status: DC | PRN

## 2023-07-24 MED ORDER — BUPIVACAINE IN DEXTROSE 0.75-8.25 % IT SOLN
INTRATHECAL | Status: DC | PRN
  Administered 2023-07-24: 2 mL via INTRATHECAL

## 2023-07-24 MED ORDER — FENTANYL CITRATE (PF) 100 MCG/2ML IJ SOLN
INTRAMUSCULAR | Status: AC
  Filled 2023-07-24: qty 2

## 2023-07-24 MED ORDER — ASPIRIN 81 MG PO CHEW
81.0000 mg | CHEWABLE_TABLET | Freq: Two times a day (BID) | ORAL | Status: DC
  Administered 2023-07-24 – 2023-07-25 (×2): 81 mg via ORAL
  Filled 2023-07-24 (×2): qty 1

## 2023-07-24 MED ORDER — DEXAMETHASONE SODIUM PHOSPHATE 10 MG/ML IJ SOLN
INTRAMUSCULAR | Status: AC
  Filled 2023-07-24: qty 1

## 2023-07-24 MED ORDER — SODIUM CHLORIDE 0.9 % IV SOLN: INTRAVENOUS | Status: DC

## 2023-07-24 MED ORDER — DIPHENHYDRAMINE HCL 12.5 MG/5ML PO ELIX: 12.5000 mg | ORAL_SOLUTION | ORAL | Status: DC | PRN

## 2023-07-24 MED ORDER — PANTOPRAZOLE SODIUM 40 MG PO TBEC
40.0000 mg | DELAYED_RELEASE_TABLET | Freq: Every day | ORAL | Status: DC
  Administered 2023-07-24 – 2023-07-25 (×2): 40 mg via ORAL
  Filled 2023-07-24 (×2): qty 1

## 2023-07-24 MED ORDER — MENTHOL 3 MG MT LOZG: 1.0000 | LOZENGE | OROMUCOSAL | Status: DC | PRN

## 2023-07-24 SURGICAL SUPPLY — 36 items
BAG COUNTER SPONGE SURGICOUNT (BAG) ×1 IMPLANT
BAG ZIPLOCK 12X15 (MISCELLANEOUS) IMPLANT
BENZOIN TINCTURE PRP APPL 2/3 (GAUZE/BANDAGES/DRESSINGS) IMPLANT
BLADE SAW SGTL 18X1.27X75 (BLADE) ×1 IMPLANT
COVER PERINEAL POST (MISCELLANEOUS) ×1 IMPLANT
COVER SURGICAL LIGHT HANDLE (MISCELLANEOUS) ×1 IMPLANT
CUP ACET PNNCL SECTR W/GRIP 56 (Hips) IMPLANT
DRAPE FOOT SWITCH (DRAPES) ×1 IMPLANT
DRAPE STERI IOBAN 125X83 (DRAPES) ×1 IMPLANT
DRAPE U-SHAPE 47X51 STRL (DRAPES) ×2 IMPLANT
DRSG AQUACEL AG ADV 3.5X10 (GAUZE/BANDAGES/DRESSINGS) ×1 IMPLANT
DURAPREP 26ML APPLICATOR (WOUND CARE) ×1 IMPLANT
ELECT REM PT RETURN 15FT ADLT (MISCELLANEOUS) ×1 IMPLANT
GAUZE XEROFORM 1X8 LF (GAUZE/BANDAGES/DRESSINGS) IMPLANT
GLOVE BIO SURGEON STRL SZ7.5 (GLOVE) ×1 IMPLANT
GLOVE BIOGEL PI IND STRL 8 (GLOVE) ×2 IMPLANT
GLOVE ECLIPSE 8.0 STRL XLNG CF (GLOVE) ×1 IMPLANT
GOWN STRL REUS W/ TWL XL LVL3 (GOWN DISPOSABLE) ×2 IMPLANT
HEAD CERAMIC 36 PLUS5 (Hips) IMPLANT
HOLDER FOLEY CATH W/STRAP (MISCELLANEOUS) ×1 IMPLANT
KIT TURNOVER KIT A (KITS) IMPLANT
PACK ANTERIOR HIP CUSTOM (KITS) ×1 IMPLANT
PINN SECTOR W/GRIP ACE CUP 56 (Hips) ×1 IMPLANT
PINNACLE ALTRX PLUS 4 N 36X56 (Hips) IMPLANT
SET HNDPC FAN SPRY TIP SCT (DISPOSABLE) ×1 IMPLANT
STAPLER SKIN PROX WIDE 3.9 (STAPLE) IMPLANT
STEM FEMORAL SZ6 HIGH ACTIS (Stem) IMPLANT
STRIP CLOSURE SKIN 1/2X4 (GAUZE/BANDAGES/DRESSINGS) IMPLANT
SUT ETHIBOND NAB CT1 #1 30IN (SUTURE) ×1 IMPLANT
SUT ETHILON 2 0 PS N (SUTURE) IMPLANT
SUT MNCRL AB 4-0 PS2 18 (SUTURE) IMPLANT
SUT VIC AB 0 CT1 36 (SUTURE) ×1 IMPLANT
SUT VIC AB 1 CT1 36 (SUTURE) ×1 IMPLANT
SUT VIC AB 2-0 CT1 TAPERPNT 27 (SUTURE) ×2 IMPLANT
TRAY FOLEY MTR SLVR 16FR STAT (SET/KITS/TRAYS/PACK) IMPLANT
YANKAUER SUCT BULB TIP NO VENT (SUCTIONS) ×1 IMPLANT

## 2023-07-24 NOTE — Anesthesia Preprocedure Evaluation (Addendum)
 Anesthesia Evaluation  Patient identified by MRN, date of birth, ID band Patient awake    Reviewed: Allergy & Precautions, NPO status , Patient's Chart, lab work & pertinent test results  History of Anesthesia Complications (+) PROLONGED EMERGENCE and history of anesthetic complications  Airway Mallampati: II  TM Distance: >3 FB Neck ROM: Full    Dental no notable dental hx. (+) Teeth Intact, Dental Advisory Given   Pulmonary neg pulmonary ROS   Pulmonary exam normal breath sounds clear to auscultation       Cardiovascular negative cardio ROS Normal cardiovascular exam Rhythm:Regular Rate:Normal     Neuro/Psych negative neurological ROS  negative psych ROS   GI/Hepatic negative GI ROS,,,(+)       alcohol use  Endo/Other  negative endocrine ROS    Renal/GU negative Renal ROS  negative genitourinary   Musculoskeletal  (+) Arthritis , Osteoarthritis,    Abdominal   Peds  Hematology negative hematology ROS (+)   Anesthesia Other Findings   Reproductive/Obstetrics negative OB ROS                             Anesthesia Physical Anesthesia Plan  ASA: 1  Anesthesia Plan: MAC and Spinal   Post-op Pain Management: Tylenol PO (pre-op)*   Induction:   PONV Risk Score and Plan: 2 and Propofol infusion and TIVA  Airway Management Planned: Natural Airway and Simple Face Mask  Additional Equipment: None  Intra-op Plan:   Post-operative Plan:   Informed Consent: I have reviewed the patients History and Physical, chart, labs and discussed the procedure including the risks, benefits and alternatives for the proposed anesthesia with the patient or authorized representative who has indicated his/her understanding and acceptance.       Plan Discussed with: CRNA  Anesthesia Plan Comments:        Anesthesia Quick Evaluation

## 2023-07-24 NOTE — Anesthesia Procedure Notes (Signed)
 Spinal  Patient location during procedure: OR Start time: 07/24/2023 2:29 AM End time: 07/24/2023 1:33 PM Reason for block: surgical anesthesia Staffing Performed: resident/CRNA  Resident/CRNA: Garth Bigness, CRNA Performed by: Garth Bigness, CRNA Authorized by: Lannie Fields, DO   Preanesthetic Checklist Completed: patient identified, IV checked, site marked, risks and benefits discussed, surgical consent, monitors and equipment checked, pre-op evaluation and timeout performed Spinal Block Patient position: sitting Prep: DuraPrep Patient monitoring: heart rate, continuous pulse ox and blood pressure Approach: midline Location: L4-5 Injection technique: single-shot Needle Needle type: Pencan  Needle gauge: 24 G Needle length: 9 cm Needle insertion depth: 4 cm Additional Notes

## 2023-07-24 NOTE — Interval H&P Note (Signed)
 History and Physical Interval Note: The patient is here today for right total hip replacement to treat his severe right hip pain and arthritis.  There has been no acute or interval change in his medical status.  The risks and benefits of surgery have been discussed in detail and informed consent has been obtained.  The right operative hip has been marked.  07/24/2023 11:36 AM  Matthew Fleming  has presented today for surgery, with the diagnosis of osteoarthritis right hip.  The various methods of treatment have been discussed with the patient and family. After consideration of risks, benefits and other options for treatment, the patient has consented to  Procedure(s): RIGHT TOTAL HIP ARTHROPLASTY ANTERIOR APPROACH (Right) as a surgical intervention.  The patient's history has been reviewed, patient examined, no change in status, stable for surgery.  I have reviewed the patient's chart and labs.  Questions were answered to the patient's satisfaction.     Kathryne Hitch

## 2023-07-24 NOTE — Transfer of Care (Signed)
 Immediate Anesthesia Transfer of Care Note  Patient: Matthew Fleming  Procedure(s) Performed: RIGHT TOTAL HIP ARTHROPLASTY ANTERIOR APPROACH (Right: Hip)  Patient Location: PACU  Anesthesia Type:Spinal  Level of Consciousness: awake, alert , and oriented  Airway & Oxygen Therapy: Patient Spontanous Breathing and Patient connected to face mask oxygen  Post-op Assessment: Report given to RN and Post -op Vital signs reviewed and stable  Post vital signs: Reviewed and stable  Last Vitals:  Vitals Value Taken Time  BP    Temp    Pulse 76 07/24/23 1455  Resp 14 07/24/23 1455  SpO2 98 % 07/24/23 1455  Vitals shown include unfiled device data.  Last Pain:  Vitals:   07/24/23 1102  TempSrc:   PainSc: 0-No pain         Complications: No notable events documented.

## 2023-07-24 NOTE — Op Note (Signed)
 Operative Note  Date of operation: 07/24/2023 Preoperative diagnosis: Right hip primary osteoarthritis Postoperative diagnosis: Same  Procedure: Right direct anterior total hip arthroplasty  Implants: Implant Name Type Inv. Item Serial No. Manufacturer Lot No. LRB No. Used Action  PINN SECTOR W/GRIP ACE CUP 56 - [id number] Hips PINN SECTOR W/GRIP ACE CUP 56  [employer] [id number] Right 1 Implanted  PINNACLE ALTRX PLUS 4 N 36X56 - [id number] Hips PINNACLE ALTRX PLUS 4 N 36X56  [employer]PU[employer] [id number] Right 1 Implanted  STEM FEMORAL SZ6 HIGH ACTIS - [id number] Stem STEM FEMORAL SZ6 HIGH ACTIS  [employer] [id number] Right 1 Implanted  HEAD CERAMIC 36 PLUS5 - [id number] Hips HEAD CERAMIC 36 PLUS5  [employer] [id number] Right 1 Implanted   Surgeon: Vanita Panda. Magnus Ivan, MD Assistant: Rexene Edison, PA-C  Anesthesia: Spinal EBL: 150 cc Antibiotics: IV Ancef Complications: None  Indications: The patient is a 73 year old active gentleman well-known to me.  I have replaced his left hip remotely back in 2017 secondary to significant left hip arthritis.  Over the last year he developed worsening right hip pain.  We saw him in the office and he had significant pain with internal and external rotation of his right hip and x-rays showing bone-on-bone wear.  He has tried and failed all forms of conservative treatment and at this point his right hip pain is daily and it is detrimentally affecting his mobility, his quality of life and his actives of daily living to the point he does wish proceed with a hip replaced on the right side.  We spoke in length in detail about the risks of acute blood loss anemia, nerve or vessel injury, fracture, infection, DVT, implant failure, dislocation, leg length differences and wound healing issues.  He understands that our goals are hopefully decreased pain, improved mobility, and improved quality of life.  Procedure description: After informed consent  was obtained and the appropriate right hip was marked, the patient was brought to the operating room and set up on the stretcher where spinal anesthesia was obtained.  He was laid in supine position on stretcher and a Foley catheter was placed.  We assess his leg lengths and then placed traction boots on both his feet.  Next he was placed supine on the Hana fracture table with a perineal post and placed in both legs and inline skeletal traction vices no traction applied.  His right operative hip and pelvis were then assessed radiographically.  The right hip was prepped and draped with DuraPrep and sterile drapes.  Timeout was called and he was identified as the correct patient the correct right hip.  An incision was then made just inferior and posterior to the ASIS and carried slightly obliquely down the leg.  Dissection was carried down to the tensor fascia lata muscle and the tensor fascia was divided longitudinally to proceed with a direct interposed the hip.  Circumflex vessels were identified and cauterized.  The hip capsule was then divided and opened up in L-type format finding a large joint effusion and significant synovitis.  There was also noted wear and tear of the femoral head and neck that was certainly consistent with severe and stage arthritis.  Cobra retractors were placed around the medial and lateral femoral neck and a femoral neck cut was made with an oscillating saw just proximal to the lesser trochanter.  This cut was completed with an osteotome.  A corkscrew guide was placed in the femoral head and the femoral head was removed its  entirety and there was a wide area devoid of cartilage.  A bent Hohmann was then placed over the medial acetabular rim and remnants of the acetabular labrum and other debris removed.  Reaming was then initiated from a size 43 reamer and stepwise increments going up to a size 55 reamer with all reamers placed under direct visualization and the last reamer placed under  direct fluoroscopy in order to obtain the depth reaming, the inclination and the anteversion.  The real DePuy Sectra GRIPTION acetabular component size 56 was then placed without difficulty followed by a 36+4 polythene liner for that size 56 acetabular component.  Attention was then turned to the femur.  With the right leg externally rotated to 120 degrees, extended and adducted, a Mueller retractors placed medially and a Hohmann retractor behind the greater trochanter.  A box cutting osteotome was used into the femoral canal and then broaching was initiated using the Actis broaching system from a size 0 going up to a size 6.  We then trialed a high offset femoral neck based off his anatomy and a 36+1.5 trial hip ball.  The right leg was brought over and up and with traction and internal rotation reduced in the pelvis.  We assessed it radiographically and clinically and felt like we needed a little more leg length.  We dislocated the hip and removed the trial components.  We then placed the real femoral component with high offset size 6 and the real 36+5 ceramic head ball.  Again reduces in acetabulum and we are pleased with leg length, offset, range of motion and stability assessed both radiographically and mechanically.  The soft tissue was then irrigated with normal saline solution.  The joint capsule was closed with interrupted #1 Ethibond suture followed by #1 Vicryl close the tensor fascia.  0 Vicryl was used to close the deep tissue and 2-0 Vicryl was used to close subcutaneous tissue.  The skin was closed with staples.  An Aquacel dressing was applied.  The patient was taken to the recovery room.  Rexene Edison, PA-C did assist during the entire case and beginning to end and his assistance was crucial and medically necessary for soft tissue management and retraction, helping guide implant placement and a layered closure of the wound.

## 2023-07-24 NOTE — Anesthesia Postprocedure Evaluation (Signed)
 Anesthesia Post Note  Patient: JALIEL DEAVERS  Procedure(s) Performed: RIGHT TOTAL HIP ARTHROPLASTY ANTERIOR APPROACH (Right: Hip)     Patient location during evaluation: PACU Anesthesia Type: MAC and Spinal Level of consciousness: awake and alert and oriented Pain management: pain level controlled Vital Signs Assessment: post-procedure vital signs reviewed and stable Respiratory status: spontaneous breathing, nonlabored ventilation and respiratory function stable Cardiovascular status: blood pressure returned to baseline and stable Postop Assessment: no headache, no backache, spinal receding and patient able to bend at knees Anesthetic complications: no   No notable events documented.  Last Vitals:  Vitals:   07/24/23 1515 07/24/23 1530  BP: 130/78 (!) 148/93  Pulse: 77 80  Resp: 13 14  Temp:    SpO2: 98% 99%    Last Pain:  Vitals:   07/24/23 1515  TempSrc:   PainSc: 6                  Lannie Fields

## 2023-07-25 ENCOUNTER — Other Ambulatory Visit (HOSPITAL_COMMUNITY): Payer: Self-pay

## 2023-07-25 DIAGNOSIS — M1611 Unilateral primary osteoarthritis, right hip: Secondary | ICD-10-CM | POA: Diagnosis not present

## 2023-07-25 LAB — CBC
HCT: 41.2 % (ref 39.0–52.0)
Hemoglobin: 13.5 g/dL (ref 13.0–17.0)
MCH: 31 pg (ref 26.0–34.0)
MCHC: 32.8 g/dL (ref 30.0–36.0)
MCV: 94.5 fL (ref 80.0–100.0)
Platelets: 200 10*3/uL (ref 150–400)
RBC: 4.36 MIL/uL (ref 4.22–5.81)
RDW: 12.6 % (ref 11.5–15.5)
WBC: 15 10*3/uL — ABNORMAL HIGH (ref 4.0–10.5)
nRBC: 0 % (ref 0.0–0.2)

## 2023-07-25 LAB — BASIC METABOLIC PANEL
Anion gap: 9 (ref 5–15)
BUN: 15 mg/dL (ref 8–23)
CO2: 26 mmol/L (ref 22–32)
Calcium: 8.5 mg/dL — ABNORMAL LOW (ref 8.9–10.3)
Chloride: 98 mmol/L (ref 98–111)
Creatinine, Ser: 0.92 mg/dL (ref 0.61–1.24)
GFR, Estimated: >60 mL/min (ref >60)
Glucose, Bld: 128 mg/dL — ABNORMAL HIGH (ref 70–99)
Potassium: 4 mmol/L (ref 3.5–5.1)
Sodium: 133 mmol/L — ABNORMAL LOW (ref 135–145)

## 2023-07-25 MED ORDER — OXYCODONE HCL 5 MG PO TABS
5.0000 mg | ORAL_TABLET | Freq: Four times a day (QID) | ORAL | 0 refills | Status: DC | PRN
Start: 2023-07-25 — End: 2023-07-28
  Filled 2023-07-25: qty 30, 4d supply, fill #0

## 2023-07-25 MED ORDER — METHOCARBAMOL 500 MG PO TABS
500.0000 mg | ORAL_TABLET | Freq: Four times a day (QID) | ORAL | 0 refills | Status: DC | PRN
  Filled 2023-07-25: qty 30, 8d supply, fill #0

## 2023-07-25 MED ORDER — ASPIRIN 81 MG PO CHEW
81.0000 mg | CHEWABLE_TABLET | Freq: Two times a day (BID) | ORAL | 0 refills | Status: DC
  Filled 2023-07-25: qty 30, 15d supply, fill #0

## 2023-07-25 NOTE — Progress Notes (Signed)
 Physical Therapy Treatment Patient Details Name: Matthew Fleming MRN: [id number] DOB: 07/12/1950 Today's Date: 07/25/2023   History of Present Illness 73 yo male presents to therapy s/p R THA, anterior approach on 07/24/2023.  PMH includes but is not limited to: L THA (2017) and B RTC repair.    PT Comments  Pt making excellent progress; meeting goals and feels ready to d/c home with spouse assisting as needed. Reviewed HEP and progression.     If plan is discharge home, recommend the following: A little help with walking and/or transfers;Assistance with cooking/housework;Help with stairs or ramp for entrance;A little help with bathing/dressing/bathroom   Can travel by private vehicle        Equipment Recommendations  Rolling walker (2 wheels)    Recommendations for Other Services       Precautions / Restrictions Precautions Precautions: Fall Recall of Precautions/Restrictions: Intact Restrictions Weight Bearing Restrictions Per Provider Order: No Other Position/Activity Restrictions: WBAT     Mobility  Bed Mobility Overal bed mobility: Needs Assistance Bed Mobility: Supine to Sit     Supine to sit: Min assist     General bed mobility comments: pt up in room    Transfers Overall transfer level: Needs assistance Equipment used: Rolling walker (2 wheels) Transfers: Sit to/from Stand Sit to Stand: Supervision           General transfer comment: cues for hand placement and RLE position    Ambulation/Gait Ambulation/Gait assistance: Supervision, Contact guard assist Gait Distance (Feet): 60 Feet Assistive device: Rolling walker (2 wheels) Gait Pattern/deviations: Step-to pattern, Decreased stance time - right, Step-through pattern       General Gait Details: cues for sequence and RW distance from self; beginning step through end of distance, good stability, no LOB   Stairs Stairs: Yes Stairs assistance: Contact guard assist, Supervision Stair Management:  Two rails, Step to pattern Number of Stairs: 5 (x2) General stair comments: cues for sequence, good stability, no LOB   Wheelchair Mobility     Tilt Bed    Modified Rankin (Stroke Patients Only)       Balance                                            Communication Communication Communication: No apparent difficulties  Cognition Arousal: Alert Behavior During Therapy: WFL for tasks assessed/performed   PT - Cognitive impairments: No apparent impairments                         Following commands: Intact      Cueing Cueing Techniques: Verbal cues  Exercises      General Comments        Pertinent Vitals/Pain Pain Assessment Pain Assessment: 0-10 Pain Score: 4  Pain Location: right hip Pain Descriptors / Indicators: Discomfort, Sore, Grimacing, Guarding Pain Intervention(s): Limited activity within patient's tolerance, Monitored during session, Premedicated before session, Repositioned    Home Living Family/patient expects to be discharged to:: Private residence Living Arrangements: Spouse/significant other Available Help at Discharge: Family;Available 24 hours/day Type of Home: House Home Access: Stairs to enter Entrance Stairs-Rails: Right;Left;Can reach both Entrance Stairs-Number of Steps: 15   Home Layout: One level Home Equipment: None      Prior Function            PT Goals (current goals can now be  found in the care plan section) Acute Rehab PT Goals PT Goal Formulation: With patient Time For Goal Achievement: 08/01/23 Potential to Achieve Goals: Good Progress towards PT goals: Progressing toward goals    Frequency    7X/week      PT Plan      Co-evaluation              AM-PAC PT "6 Clicks" Mobility   Outcome Measure  Help needed turning from your back to your side while in a flat bed without using bedrails?: A Little Help needed moving from lying on your back to sitting on the side of a flat  bed without using bedrails?: A Little Help needed moving to and from a bed to a chair (including a wheelchair)?: None Help needed standing up from a chair using your arms (e.g., wheelchair or bedside chair)?: None Help needed to walk in hospital room?: None Help needed climbing 3-5 steps with a railing? : None 6 Click Score: 22    End of Session Equipment Utilized During Treatment: Gait belt Activity Tolerance: Patient tolerated treatment well Patient left: with call bell/phone within reach;in chair;with chair alarm set Nurse Communication: Mobility status PT Visit Diagnosis: Other abnormalities of gait and mobility (R26.89)     Time: 7829-5621 PT Time Calculation (min) (ACUTE ONLY): 25 min  Charges:    $Gait Training: 23-37 mins PT General Charges $$ ACUTE PT VISIT: 1 Visit                     Gretchen Weinfeld, PT  Acute Rehab Dept ([facility]) [phone]  07/25/2023    [facility] 07/25/2023, 1:08 PM

## 2023-07-25 NOTE — TOC Transition Note (Signed)
 Transition of Care [facility]) - Discharge Note   Patient Details  Name: CADIN LUKA MRN: [id number] Date of Birth: 12/14/1950  [facility] CM/SW Contact:  Lanier Clam, RN Phone Number: 07/25/2023, 10:58 AM   Clinical Narrative:patient with no preference for dme-he declines any HHC or 3n1.Patient only wants rw-[employer] rep Jermaine to deliver rw to rm prior d/c. No further Cm needs.       Final next level of care: Home/Self Care Barriers to Discharge: No Barriers Identified   Patient Goals and CMS Choice   CMS Medicare.gov Compare Post Acute Care list provided to:: Patient Choice offered to / list presented to : Patient [place] ownership interest in [facility].provided to:: Patient    Discharge Placement                       Discharge Plan and Services Additional resources added to the After Visit Summary for     Discharge Planning Services: CM Consult            DME Arranged: Dan Humphreys rolling DME Agency: [employer] Date DME Agency Contacted: 07/25/23 Time DME Agency Contacted: 1057              Social Drivers of Health (SDOH) Interventions SDOH Screenings   Food Insecurity: No Food Insecurity (07/24/2023)  Housing: Low Risk  (07/24/2023)  Transportation Needs: No Transportation Needs (07/24/2023)  Utilities: Not At Risk (07/24/2023)  Social Connections: Moderately Integrated (07/24/2023)  Tobacco Use: Low Risk  (07/24/2023)     Readmission Risk Interventions     No data to display

## 2023-07-25 NOTE — Care Management Obs Status (Signed)
 MEDICARE OBSERVATION STATUS NOTIFICATION   Patient Details  Name: Matthew Fleming MRN: [id number] Date of Birth: [birth year]   Medicare Observation Status Notification Given:  Yes    MahabirOlegario Messier, RN 07/25/2023, 10:21 AM

## 2023-07-25 NOTE — Progress Notes (Signed)
 Patient discharged to home w/ family. Given all belongings, instructions, equipment. Verbalized understanding of all instructions. Escorted to pov via w/c.

## 2023-07-25 NOTE — Discharge Summary (Signed)
 Patient ID: Matthew Fleming MRN: [id number] DOB/AGE: 02/16/1951 73 y.o.  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 male who was admitted 07/24/2023 for operative treatment ofUnilateral primary osteoarthritis, right hip. Patient has severe unremitting pain that affects sleep, daily activities, and work/hobbies. After pre-op clearance the patient was taken to the operating room on 07/24/2023 and underwent  Procedure(s): RIGHT TOTAL HIP ARTHROPLASTY ANTERIOR APPROACH.    Patient was given perioperative antibiotics:  Anti-infectives (From admission, onward)    Start     Dose/Rate Route Frequency Ordered Stop   07/24/23 2000  ceFAZolin (ANCEF) IVPB 2g/100 mL premix        2 g 200 mL/hr over 30 Minutes Intravenous Every 6 hours 07/24/23 1632 07/25/23 0205   07/24/23 1030  ceFAZolin (ANCEF) IVPB 2g/100 mL premix        2 g 200 mL/hr over 30 Minutes Intravenous On call to O.R. 07/24/23 1029 07/24/23 1342        Patient was given sequential compression devices, early ambulation, and chemoprophylaxis to prevent DVT.  Patient benefited maximally from hospital stay and there  were no complications.    Recent vital signs: Patient Vitals for the past 24 hrs:  BP Temp Temp src Pulse Resp SpO2 Height Weight  07/25/23 0945 119/79 98.5 F (36.9 C) -- 77 18 99 % -- --  07/25/23 0516 137/74 98.3 F (36.8 C) Oral 72 18 97 % -- --  07/25/23 0138 128/82 (!) 97.5 F (36.4 C) Oral 86 16 95 % -- --  07/24/23 2242 (!) 164/92 97.9 F (36.6 C) Oral 73 18 99 % -- --  07/24/23 2058 (!) 156/100 98.2 F (36.8 C) Oral 77 18 96 % -- --  07/24/23 2009 110/68 97.7 F (36.5 C) Oral (!) 59 16 98 % -- --  07/24/23 1912 -- -- -- -- -- -- 5\' 10"  (1.778 m) --  07/24/23 1904 (!) 166/86 98 F (36.7 C) Oral 85 16 98 % -- --  07/24/23 1800 (!) 148/91 -- -- 77 -- 99 % -- --  07/24/23 1700 (!) 145/90 -- -- 69 -- 97 % -- --  07/24/23 1600 (!) 147/95 97.6 F (36.4 C) -- 82 14 97 % -- --  07/24/23 1545 (!) 147/91 -- -- 84 15 98 % -- --  07/24/23 1530 (!) 148/93 -- -- 80 14 99 % -- --  07/24/23 1515 130/78 -- -- 77 13 98 % -- --  07/24/23 1500 116/75 -- -- 78 15 98 % -- --  07/24/23 1455 102/72 97.7 F (36.5 C) -- 76 14 98 % -- --  07/24/23 1102 -- -- -- -- -- -- -- 77.1 kg     Recent laboratory studies:  Recent Labs    07/25/23 0354  WBC 15.0*  HGB 13.5  HCT 41.2  PLT 200  NA 133*  K 4.0  CL 98  CO2 26  BUN 15  CREATININE 0.92  GLUCOSE 128*  CALCIUM 8.5*     Discharge Medications:   Allergies as of 07/25/2023   No Known Allergies      Medication List     TAKE these medications    aspirin 81 MG chewable tablet Chew 1 tablet (81 mg total) by mouth 2 (two) times daily.   [employer]ys Extra Strength [id number] MG Pack Generic drug: Aspirin-Acetaminophen-Caffeine Take 1 packet by mouth 2 (two) times daily as needed (pain.).   methocarbamol 500 MG tablet Commonly known as: ROBAXIN Take 1 tablet (500 mg total) by mouth every 6 (six) hours as needed for muscle spasms.   oxyCODONE 5 MG immediate release tablet Commonly known as: Oxy IR/ROXICODONE Take 1-2 tablets (5-10  mg total) by mouth every 6 (six) hours as needed for moderate pain (pain score 4-6) (pain score 4-6).               Durable Medical Equipment  (From admission, onward)           Start     Ordered   07/25/23 1041  For home use only DME Walker rolling  Once       Question Answer Comment  Walker: With 5 Inch Wheels   Patient needs a walker to treat with the following condition Difficulty in walking, not elsewhere classified      07/25/23 1041   07/24/23 1851  DME 3 n 1  Once        07/24/23 1850   07/24/23 1851  DME Walker rolling  Once       Question Answer Comment  Walker: With 5 Inch Wheels   Patient needs a walker to treat with the following condition Status post total replacement of right hip      07/24/23 1850            Diagnostic Studies: DG Pelvis Portable Result Date: 07/24/2023 CLINICAL DATA:  [id number] Status post total replacement of right hip [id number] EXAM: PORTABLE PELVIS 1-2 VIEWS COMPARISON:  None Available. FINDINGS: Right hip arthroplasty in expected alignment. No periprosthetic lucency or fracture. Recent postsurgical change includes air and edema in the soft tissues. Lateral skin staples in place. Previous left hip arthroplasty. IMPRESSION: Right hip arthroplasty without immediate postoperative complications. Electronically Signed   By: Narda Rutherford M.D.   On: 07/24/2023 17:26   DG HIP UNILAT WITH PELVIS 1V RIGHT Result Date: 07/24/2023 CLINICAL DATA:  Elective surgery. EXAM: DG HIP (WITH OR WITHOUT PELVIS) 1V RIGHT COMPARISON:  None Available. FINDINGS: Three fluoroscopic spot views of the pelvis and right hip obtained in the operating room. Images during hip arthroplasty. Prior left hip arthroplasty. Fluoroscopy time 22 seconds. Dose 1.8076 mGy. IMPRESSION: Procedural fluoroscopy during right hip arthroplasty. Electronically Signed   By: Narda Rutherford M.D.   On: 07/24/2023 17:26   DG C-Arm 1-60 Min-No Report Result Date: 07/24/2023 Fluoroscopy was  utilized by the requesting physician.  No radiographic interpretation.    Disposition: Discharge disposition: 01-Home or Self Care          Follow-up Information     Kathryne Hitch, MD Follow up in 2 week(s).   Specialty: Orthopedic Surgery Contact  information: [address] [place] [address] [phone]                  Signed: Kathryne Hitch 07/25/2023, 10:53 AM

## 2023-07-25 NOTE — Evaluation (Signed)
 Physical Therapy Evaluation Patient Details Name: Matthew Fleming MRN: [id number] DOB: 1950-10-29 Today's Date: 07/25/2023  History of Present Illness  73 yo male presents to therapy s/p R THA, anterior approach on 07/24/2023.  PMH includes but is not limited to: L THA (2017) and B RTC repair.  Clinical Impression  Pt is s/p THA resulting in the deficits listed below (see PT Problem List).  Pt amb ~ 84' with RW, doing well, pain somewhat improved with mobility, had some mild nausea--RN notified.  Pt has 15 steps to enter home in [facility], will see again later today, pt is hopeful to d/c around noon.   Pt will benefit from acute skilled PT to increase their independence and safety with mobility to facilitate discharge.          If plan is discharge home, recommend the following: A little help with walking and/or transfers;Assistance with cooking/housework;Help with stairs or ramp for entrance;A little help with bathing/dressing/bathroom   Can travel by private vehicle        Equipment Recommendations Rolling walker (2 wheels)  Recommendations for Other Services       Functional Status Assessment Patient has had a recent decline in their functional status and demonstrates the ability to make significant improvements in function in a reasonable and predictable amount of time.     Precautions / Restrictions Precautions Precautions: Fall Restrictions Weight Bearing Restrictions Per Provider Order: No Other Position/Activity Restrictions: WBAT      Mobility  Bed Mobility Overal bed mobility: Needs Assistance Bed Mobility: Supine to Sit     Supine to sit: Min assist     Matthew bed mobility comments: assist for RLE    Transfers Overall transfer level: Needs assistance Equipment used: Rolling walker (2 wheels) Transfers: Sit to/from Stand Sit to Stand: Contact guard assist           Matthew transfer comment: cues for hand placement and RLE position     Ambulation/Gait Ambulation/Gait assistance: Contact guard assist Gait Distance (Feet): 60 Feet Assistive device: Rolling walker (2 wheels) Gait Pattern/deviations: Step-to pattern, Decreased stance time - right       Matthew Gait Details: cues for sequence and  RW distance from self  Stairs            Wheelchair Mobility     Tilt Bed    Modified Rankin (Stroke Patients Only)       Balance                                             Pertinent Vitals/Pain Pain Assessment Pain Assessment: 0-10 Pain Score: 6  Pain Location: right hip Pain Descriptors / Indicators: Discomfort, Sore, Grimacing, Guarding Pain Intervention(s): Limited activity within patient's tolerance, Monitored during session, Premedicated before session    Home Living Family/patient expects to be discharged to:: Private residence Living Arrangements: Spouse/significant other Available Help at Discharge: Family;Available 24 hours/day Type of Home: House Home Access: Stairs to enter Entrance Stairs-Rails: Right;Left;Can reach both Entrance Stairs-Number of Steps: 15   Home Layout: One level Home Equipment: None      Prior Function Prior Level of Function : Independent/Modified Independent                     Extremity/Trunk Assessment   Upper Extremity Assessment Upper Extremity Assessment: Overall WFL for tasks assessed  Lower Extremity Assessment Lower Extremity Assessment: RLE deficits/detail RLE Deficits / Details: ankle WFL, knee and hip 3/5, limited by anticipated post op complications       Communication   Communication Communication: No apparent difficulties    Cognition Arousal: Alert Behavior During Therapy: WFL for tasks assessed/performed   PT - Cognitive impairments: No apparent impairments                         Following commands: Intact       Cueing Cueing Techniques: Verbal cues     Matthew Comments       Exercises     Assessment/Plan    PT Assessment Patient needs continued PT services  PT Problem List Decreased strength;Decreased range of motion;Decreased activity tolerance;Decreased balance;Decreased mobility;Decreased knowledge of use of DME;Pain       PT Treatment Interventions Therapeutic exercise;DME instruction;Gait training;Functional mobility training;Therapeutic activities;Patient/family education;Stair training    PT Goals (Current goals can be found in the Care Plan section)  Acute Rehab PT Goals PT Goal Formulation: With patient Time For Goal Achievement: 08/01/23 Potential to Achieve Goals: Good    Frequency 7X/week     Co-evaluation               AM-PAC PT "6 Clicks" Mobility  Outcome Measure Help needed turning from your back to your side while in a flat bed without using bedrails?: A Little Help needed moving from lying on your back to sitting on the side of a flat bed without using bedrails?: A Little Help needed moving to and from a bed to a chair (including a wheelchair)?: A Little Help needed standing up from a chair using your arms (e.g., wheelchair or bedside chair)?: A Little Help needed to walk in hospital room?: A Little Help needed climbing 3-5 steps with a railing? : A Little 6 Click Score: 18    End of Session Equipment Utilized During Treatment: Gait belt Activity Tolerance: Patient tolerated treatment well Patient left: with call bell/phone within reach;in chair;with chair alarm set Nurse Communication: Mobility status PT Visit Diagnosis: Other abnormalities of gait and mobility (R26.89)    Time: 8119-1478 PT Time Calculation (min) (ACUTE ONLY): 23 min   Charges:   PT Evaluation $PT Eval Low Complexity: 1 Low PT Treatments $Gait Training: 8-22 mins PT Matthew Charges $$ ACUTE PT VISIT: 1 Visit         Jodilyn Giese, PT  Acute Rehab Dept [facility]) [phone]  07/25/2023   [facility] 07/25/2023, 9:41 AM

## 2023-07-25 NOTE — Discharge Instructions (Signed)
 Per [facility] clinic policy, our goal is ensure optimal postoperative pain control with a multimodal pain management strategy. For all [facility] patients, our goal is to wean post-operative narcotic medications by 6 weeks post-operatively. If this is not possible due to utilization of pain medication prior to surgery, your [facility] doctor will support your acute post-operative pain control for the first 6 weeks postoperatively, with a plan to transition you back to your primary pain team following that. Matthew Fleming will work to ensure a Therapist, occupational.  INSTRUCTIONS AFTER JOINT REPLACEMENT   Remove items at home which could result in a fall. This includes throw rugs or furniture in walking pathways ICE to the affected joint every three hours while awake for 30 minutes at a time, for at least the first 3-5 days, and then as needed for pain and swelling.  Continue to use ice for pain and swelling. You may notice swelling that will progress down to the foot and ankle.  This is normal after surgery.  Elevate your leg when you are not up walking on it.   Continue to use the breathing machine you got in the hospital (incentive spirometer) which will help keep your temperature down.  It is common for your temperature to cycle up and down following surgery, especially at night when you are not up moving around and exerting yourself.  The breathing machine keeps your lungs expanded and your temperature down.   DIET:  As you were doing prior to hospitalization, we recommend a well-balanced diet.  DRESSING / WOUND CARE / SHOWERING  Keep the surgical dressing until follow up.  The dressing is water proof, so you can shower without any extra covering.  IF THE DRESSING FALLS OFF or the wound gets wet inside, change the dressing with sterile gauze.  Please use good hand washing techniques before changing the dressing.  Do not use any lotions or creams on the incision until instructed by your surgeon.     ACTIVITY  Increase activity slowly as tolerated, but follow the weight bearing instructions below.   No driving for 6 weeks or until further direction given by your physician.  You cannot drive while taking narcotics.  No lifting or carrying greater than 10 lbs. until further directed by your surgeon. Avoid periods of inactivity such as sitting longer than an hour when not asleep. This helps prevent blood clots.  You may return to work once you are authorized by your doctor.     WEIGHT BEARING   Weight bearing as tolerated with assist device (walker, cane, etc) as directed, use it as long as suggested by your surgeon or therapist, typically at least 4-6 weeks.   EXERCISES  Results after joint replacement surgery are often greatly improved when you follow the exercise, range of motion and muscle strengthening exercises prescribed by your doctor. Safety measures are also important to protect the joint from further injury. Any time any of these exercises cause you to have increased pain or swelling, decrease what you are doing until you are comfortable again and then slowly increase them. If you have problems or questions, call your caregiver or physical therapist for advice.   Rehabilitation is important following a joint replacement. After just a few days of immobilization, the muscles of the leg can become weakened and shrink (atrophy).  These exercises are designed to build up the tone and strength of the thigh and leg muscles and to improve motion. Often times heat used for twenty to thirty minutes before  working out will loosen up your tissues and help with improving the range of motion but do not use heat for the first two weeks following surgery (sometimes heat can increase post-operative swelling).   These exercises can be done on a training (exercise) mat, on the floor, on a table or on a bed. Use whatever works the best and is most comfortable for you.    Use music or television  while you are exercising so that the exercises are a pleasant break in your day. This will make your life better with the exercises acting as a break in your routine that you can look forward to.   Perform all exercises about fifteen times, three times per day or as directed.  You should exercise both the operative leg and the other leg as well.  Exercises include:   Quad Sets - Tighten up the muscle on the front of the thigh (Quad) and hold for 5-10 seconds.   Straight Leg Raises - With your knee straight (if you were given a brace, keep it on), lift the leg to 60 degrees, hold for 3 seconds, and slowly lower the leg.  Perform this exercise against resistance later as your leg gets stronger.  Leg Slides: Lying on your back, slowly slide your foot toward your buttocks, bending your knee up off the floor (only go as far as is comfortable). Then slowly slide your foot back down until your leg is flat on the floor again.  Angel Wings: Lying on your back spread your legs to the side as far apart as you can without causing discomfort.  Hamstring Strength:  Lying on your back, push your heel against the floor with your leg straight by tightening up the muscles of your buttocks.  Repeat, but this time bend your knee to a comfortable angle, and push your heel against the floor.  You may put a pillow under the heel to make it more comfortable if necessary.   A rehabilitation program following joint replacement surgery can speed recovery and prevent re-injury in the future due to weakened muscles. Contact your doctor or a physical therapist for more information on knee rehabilitation.    CONSTIPATION  Constipation is defined medically as fewer than three stools per week and severe constipation as less than one stool per week.  Even if you have a regular bowel pattern at home, your normal regimen is likely to be disrupted due to multiple reasons following surgery.  Combination of anesthesia, postoperative  narcotics, change in appetite and fluid intake all can affect your bowels.   YOU MUST use at least one of the following options; they are listed in order of increasing strength to get the job done.  They are all available over the counter, and you may need to use some, POSSIBLY even all of these options:    Drink plenty of fluids (prune juice may be helpful) and high fiber foods Colace 100 mg by mouth twice a day  Senokot for constipation as directed and as needed Dulcolax (bisacodyl), take with full glass of water  Miralax (polyethylene glycol) once or twice a day as needed.  If you have tried all these things and are unable to have a bowel movement in the first 3-4 days after surgery call either your surgeon or your primary doctor.    If you experience loose stools or diarrhea, hold the medications until you stool forms back up.  If your symptoms do not get better within 1 week  or if they get worse, check with your doctor.  If you experience "the worst abdominal pain ever" or develop nausea or vomiting, please contact the office immediately for further recommendations for treatment.   ITCHING:  If you experience itching with your medications, try taking only a single pain pill, or even half a pain pill at a time.  You can also use Benadryl over the counter for itching or also to help with sleep.   TED HOSE STOCKINGS:  Use stockings on both legs until for at least 2 weeks or as directed by physician office. They may be removed at night for sleeping.  MEDICATIONS:  See your medication summary on the "After Visit Summary" that nursing will review with you.  You may have some home medications which will be placed on hold until you complete the course of blood thinner medication.  It is important for you to complete the blood thinner medication as prescribed.  PRECAUTIONS:  If you experience chest pain or shortness of breath - call 911 immediately for transfer to the hospital emergency department.    If you develop a fever greater that 101 F, purulent drainage from wound, increased redness or drainage from wound, foul odor from the wound/dressing, or calf pain - CONTACT YOUR SURGEON.                                                   FOLLOW-UP APPOINTMENTS:  If you do not already have a post-op appointment, please call the office for an appointment to be seen by your surgeon.  Guidelines for how soon to be seen are listed in your "After Visit Summary", but are typically between 1-4 weeks after surgery.  OTHER INSTRUCTIONS:   Knee Replacement:  Do not place pillow under knee, focus on keeping the knee straight while resting. CPM instructions: 0-90 degrees, 2 hours in the morning, 2 hours in the afternoon, and 2 hours in the evening. Place foam block, curve side up under heel at all times except when in CPM or when walking.  DO NOT modify, tear, cut, or change the foam block in any way.  POST-OPERATIVE OPIOID TAPER INSTRUCTIONS: It is important to wean off of your opioid medication as soon as possible. If you do not need pain medication after your surgery it is ok to stop day one. Opioids include: Codeine, Hydrocodone(Norco, Vicodin), Oxycodone(Percocet, oxycontin) and hydromorphone amongst others.  Long term and even short term use of opiods can cause: Increased pain response Dependence Constipation Depression Respiratory depression And more.  Withdrawal symptoms can include Flu like symptoms Nausea, vomiting And more Techniques to manage these symptoms Hydrate well Eat regular healthy meals Stay active Use relaxation techniques(deep breathing, meditating, yoga) Do Not substitute Alcohol to help with tapering If you have been on opioids for less than two weeks and do not have pain than it is ok to stop all together.  Plan to wean off of opioids This plan should start within one week post op of your joint replacement. Maintain the same interval or time between taking each dose  and first decrease the dose.  Cut the total daily intake of opioids by one tablet each day Next start to increase the time between doses. The last dose that should be eliminated is the evening dose.   MAKE SURE YOU:  Understand these instructions.  Get help right away if you are not doing well or get worse.    Thank you for letting us be a part of your medical care team.  It is a privilege we respect greatly.  We hope these instructions will help you stay on track for a fast and full recovery!      Dental Antibiotics:  In most cases prophylactic antibiotics for Dental procdeures after total joint surgery are not necessary.  Exceptions are as follows:  1. History of prior total joint infection  2. Severely immunocompromised (Organ Transplant, cancer chemotherapy, Rheumatoid biologic meds such as Humera)  3. Poorly controlled diabetes (A1C &gt; 8.0, blood glucose over 200)  If you have one of these conditions, contact your surgeon for an antibiotic prescription, prior to your dental procedure.

## 2023-07-25 NOTE — Progress Notes (Signed)
 Subjective: 1 Day Post-Op Procedure(s) (LRB): RIGHT TOTAL HIP ARTHROPLASTY ANTERIOR APPROACH (Right) Patient reports pain as moderate.    Objective: Vital signs in last 24 hours: Temp:  [97.5 F (36.4 C)-98.5 F (36.9 C)] 98.5 F (36.9 C) (03/01 0945) Pulse Rate:  [59-86] 77 (03/01 0945) Resp:  [13-18] 18 (03/01 0945) BP: (102-166)/(68-100) 119/79 (03/01 0945) SpO2:  [95 %-99 %] 99 % (03/01 0945) Weight:  [77.1 kg] 77.1 kg (02/28 1102)  Intake/Output from previous day: 02/28 0701 - 03/01 0700 In: 3399.7 [P.O.:720; I.V.:2383.1; IV Piggyback:296.6] Out: 2600 [Urine:2450; Blood:150] Intake/Output this shift: Total I/O In: 240 [P.O.:240] Out: -   Recent Labs    07/25/23 0354  HGB 13.5   Recent Labs    07/25/23 0354  WBC 15.0*  RBC 4.36  HCT 41.2  PLT 200   Recent Labs    07/25/23 0354  NA 133*  K 4.0  CL 98  CO2 26  BUN 15  CREATININE 0.92  GLUCOSE 128*  CALCIUM 8.5*   No results for input(s): "LABPT", "INR" in the last 72 hours.  Sensation intact distally Intact pulses distally Dorsiflexion/Plantar flexion intact Incision: dressing C/D/I   Assessment/Plan: 1 Day Post-Op Procedure(s) (LRB): RIGHT TOTAL HIP ARTHROPLASTY ANTERIOR APPROACH (Right) Up with therapy Discharge home with home health      Kathryne Hitch 07/25/2023, 10:51 AM

## 2023-07-27 ENCOUNTER — Encounter (HOSPITAL_COMMUNITY): Payer: Self-pay | Admitting: Orthopaedic Surgery

## 2023-07-28 ENCOUNTER — Other Ambulatory Visit: Payer: Self-pay | Admitting: Orthopaedic Surgery

## 2023-07-28 ENCOUNTER — Telehealth: Payer: Self-pay | Admitting: Orthopaedic Surgery

## 2023-07-28 ENCOUNTER — Other Ambulatory Visit (HOSPITAL_COMMUNITY): Payer: Self-pay

## 2023-07-28 MED ORDER — OXYCODONE HCL 5 MG PO TABS
5.0000 mg | ORAL_TABLET | Freq: Four times a day (QID) | ORAL | 0 refills | Status: DC | PRN
Start: 2023-07-28 — End: 2023-08-10

## 2023-07-28 MED ORDER — OXYCODONE HCL 5 MG PO TABS
5.0000 mg | ORAL_TABLET | Freq: Four times a day (QID) | ORAL | 0 refills | Status: DC | PRN
Start: 2023-07-28 — End: 2023-07-28
  Filled 2023-07-28: qty 30, 4d supply, fill #0

## 2023-07-28 NOTE — Telephone Encounter (Signed)
 Patient called says the oxycodone should be sent to [id number] - [place], [place] - [address] [[id number]]

## 2023-08-10 ENCOUNTER — Encounter: Payer: Self-pay | Admitting: Internal Medicine

## 2023-08-10 ENCOUNTER — Encounter: Payer: Self-pay | Admitting: Orthopaedic Surgery

## 2023-08-10 ENCOUNTER — Ambulatory Visit: Payer: Medicare Other | Admitting: Internal Medicine

## 2023-08-10 ENCOUNTER — Ambulatory Visit (INDEPENDENT_AMBULATORY_CARE_PROVIDER_SITE_OTHER): Payer: Medicare Other | Admitting: Orthopaedic Surgery

## 2023-08-10 VITALS — BP 122/79 | HR 79 | Temp 97.9°F | Resp 12 | Ht 70.0 in | Wt 170.0 lb

## 2023-08-10 DIAGNOSIS — Z800 Family history of malignant neoplasm of digestive organs: Secondary | ICD-10-CM

## 2023-08-10 DIAGNOSIS — K648 Other hemorrhoids: Secondary | ICD-10-CM

## 2023-08-10 DIAGNOSIS — Z860100 Personal history of colon polyps, unspecified: Secondary | ICD-10-CM

## 2023-08-10 DIAGNOSIS — D124 Benign neoplasm of descending colon: Secondary | ICD-10-CM

## 2023-08-10 DIAGNOSIS — Z860101 Personal history of adenomatous and serrated colon polyps: Secondary | ICD-10-CM

## 2023-08-10 DIAGNOSIS — Z96641 Presence of right artificial hip joint: Secondary | ICD-10-CM

## 2023-08-10 DIAGNOSIS — Z1211 Encounter for screening for malignant neoplasm of colon: Secondary | ICD-10-CM | POA: Diagnosis not present

## 2023-08-10 MED ORDER — SODIUM CHLORIDE 0.9 % IV SOLN: 500.0000 mL | Freq: Once | INTRAVENOUS | Status: DC

## 2023-08-10 NOTE — Op Note (Signed)
 [facility] Patient Name: Matthew Fleming Procedure Date: 08/10/2023 12:29 PM MRN: [id number] Endoscopist: Wilhemina Bonito. Marina Goodell , MD, [id number] Age: 73 Referring MD:  Date of Birth: 09-06-1950 Gender: Male Account #: [id number] Procedure:                Colonoscopy cold snare polypectomy x 1 Indications:              High risk colon cancer surveillance: Personal                            history of non-advanced adenomas. Also family                            history of colon cancer in his father less than age                            89 and 2 maternal cousins. Previous examinations                            2000, 2004, 2008, 2014, 2019 Medicines:                Monitored Anesthesia Care Procedure:                Pre-Anesthesia Assessment:                           - Prior to the procedure, a History and Physical                            was performed, and patient medications and                            allergies were reviewed. The patient's tolerance of                            previous anesthesia was also reviewed. The risks                            and benefits of the procedure and the sedation                            options and risks were discussed with the patient.                            All questions were answered, and informed consent                            was obtained. Prior Anticoagulants: The patient has                            taken no anticoagulant or antiplatelet agents. ASA                            Grade Assessment: II - A patient with mild systemic  disease. After reviewing the risks and benefits,                            the patient was deemed in satisfactory condition to                            undergo the procedure.                           After obtaining informed consent, the colonoscope                            was passed under direct vision. Throughout the                            procedure, the  patient's blood pressure, pulse, and                            oxygen saturations were monitored continuously. The                            Olympus Scope SN: [id number] was introduced through                            the anus and advanced to the the cecum, identified                            by appendiceal orifice and ileocecal valve. The                            ileocecal valve, appendiceal orifice, and rectum                            were photographed. The quality of the bowel                            preparation was excellent. The colonoscopy was                            performed without difficulty. The patient tolerated                            the procedure well. The bowel preparation used was                            SUPREP via split dose instruction. Scope In: 1:10:31 PM Scope Out: 1:27:54 PM Scope Withdrawal Time: 0 hours 10 minutes 28 seconds  Total Procedure Duration: 0 hours 17 minutes 23 seconds  Findings:                 A 2 mm polyp was found in the descending colon. The                            polyp was removed with a cold snare.  Resection and                            retrieval were complete.                           Internal hemorrhoids were found during                            retroflexion. The hemorrhoids were moderate.                           The exam was otherwise without abnormality on                            direct and retroflexion views. Complications:            No immediate complications. Estimated blood loss:                            None. Estimated Blood Loss:     Estimated blood loss: none. Impression:               - One 2 mm polyp in the descending colon, removed                            with a cold snare. Resected and retrieved.                           - Internal hemorrhoids.                           - The examination was otherwise normal on direct                            and retroflexion views. Recommendation:            - Repeat colonoscopy in 5 years for surveillance.                           - Patient has a contact number available for                            emergencies. The signs and symptoms of potential                            delayed complications were discussed with the                            patient. Return to normal activities tomorrow.                            Written discharge instructions were provided to the                            patient.                           -  Resume previous diet.                           - Continue present medications.                           - Await pathology results. Wilhemina Bonito. Marina Goodell, MD 08/10/2023 1:32:24 PM This report has been signed electronically.

## 2023-08-10 NOTE — Progress Notes (Signed)
 Sedate, gd SR, tolerated procedure well, VSS, report to RN

## 2023-08-10 NOTE — Progress Notes (Signed)
 Called to room to assist during endoscopic procedure.  Patient ID and intended procedure confirmed with present staff. Received instructions for my participation in the procedure from the performing physician.

## 2023-08-10 NOTE — Progress Notes (Signed)
 The patient is 2 weeks status post a right total hip arthroplasty.  He says that hip is doing great.  He actually has a colonoscopy today.  [place] to [facility] for his health care.  We replaced his left hip back in 2017.  He says he has good range of motion and strength.  He is already walking without an assistive ice.  His right hip incision looks good.  The staples are removed and Steri-Strips applied.  There is no significant seroma.  From my standpoint I do not need to see him back for 6 months unless he is having any issues.  Will have a standing AP pelvis at that visit.

## 2023-08-10 NOTE — Progress Notes (Signed)
 HISTORY OF PRESENT ILLNESS:  Matthew Fleming is a 73 y.o. male with a personal history of adenomatous colon polyps and strong family history of colon cancer in both first and second-degree relatives.  Now for surveillance colonoscopy  REVIEW OF SYSTEMS:  All non-GI ROS negative except for  Past Medical History:  Diagnosis Date   Allergy    seasonal   Arthritis    "left hip" (08/07/2015)   Complication of anesthesia 1996   Difficulty to awaken   CONSTIPATION 03/05/2010   Qualifier: Diagnosis of  By: Wilmon Pali NP, Paula     Heart murmur    at birth but has outgrown   HEMORRHOIDS-INTERNAL 03/05/2010   Qualifier: Diagnosis of  By: Wilmon Pali NP, Gunnar Fusi     RECTAL BLEEDING 03/05/2010   Qualifier: Diagnosis of  By: Wilmon Pali NP, Gunnar Fusi      Past Surgical History:  Procedure Laterality Date   COLONOSCOPY     COLONOSCOPY W/ POLYPECTOMY     EYE SURGERY Bilateral 2001   Lasik surgery   POLYPECTOMY     SHOULDER ARTHROSCOPY W/ ROTATOR CUFF REPAIR Bilateral 1996 , 2012   SIGMOIDOSCOPY     TOTAL HIP ARTHROPLASTY Left 08/07/2015   Procedure: LEFT TOTAL HIP ARTHROPLASTY ANTERIOR APPROACH;  Surgeon: Kathryne Hitch, MD;  Location: [facility] OR;  Service: Orthopedics;  Laterality: Left;   TOTAL HIP ARTHROPLASTY Right 07/24/2023   Procedure: RIGHT TOTAL HIP ARTHROPLASTY ANTERIOR APPROACH;  Surgeon: Kathryne Hitch, MD;  Location: [facility];  Service: Orthopedics;  Laterality: Right;   UPPER GASTROINTESTINAL ENDOSCOPY      Social History Matthew Fleming  reports that he has never smoked. He has never used smokeless tobacco. He reports current alcohol use of about 8.0 standard drinks of alcohol per week. He reports that he does not use drugs.  family history includes Colon cancer in his cousin; Colon cancer (age of onset: 75) in his father; Heart attack in his father; Heart disease in his father; Hypertension in his mother; Pancreatic cancer in his mother.  No Known Allergies     PHYSICAL  EXAMINATION: Vital signs: BP (!) 141/84   Pulse 65   Temp 97.9 F (36.6 C)   Resp 10   Ht 5\' 10"  (1.778 m)   Wt 170 lb (77.1 kg)   SpO2 100%   BMI 24.39 kg/m  General: Well-developed, well-nourished, no acute distress HEENT: Sclerae are anicteric, conjunctiva pink. Oral mucosa intact Lungs: Clear Heart: Regular Abdomen: soft, nontender, nondistended, no obvious ascites, no peritoneal signs, normal bowel sounds. No organomegaly. Extremities: No edema Psychiatric: alert and oriented x3. Cooperative     ASSESSMENT:  Personal history of adenomatous polyps and family history of colon cancer   PLAN: Surveillance colonoscopy

## 2023-08-10 NOTE — Patient Instructions (Signed)
-  Handout on polyps, hemorrhoids provided -await pathology results -repeat colonoscopy in 5 years for surveillance recommended.  -Continue present medications   YOU HAD AN ENDOSCOPIC PROCEDURE TODAY AT [facility]:   Refer to the procedure report that was given to you for any specific questions about what was found during the examination.  If the procedure report does not answer your questions, please call your gastroenterologist to clarify.  If you requested that your care partner not be given the details of your procedure findings, then the procedure report has been included in a sealed envelope for you to review at your convenience later.  YOU SHOULD EXPECT: Some feelings of bloating in the abdomen. Passage of more gas than usual.  Walking can help get rid of the air that was put into your GI tract during the procedure and reduce the bloating. If you had a lower endoscopy (such as a colonoscopy or flexible sigmoidoscopy) you may notice spotting of blood in your stool or on the toilet paper. If you underwent a bowel prep for your procedure, you may not have a normal bowel movement for a few days.  Please Note:  You might notice some irritation and congestion in your nose or some drainage.  This is from the oxygen used during your procedure.  There is no need for concern and it should clear up in a day or so.  SYMPTOMS TO REPORT IMMEDIATELY:  Following lower endoscopy (colonoscopy or flexible sigmoidoscopy):  Excessive amounts of blood in the stool  Significant tenderness or worsening of abdominal pains  Swelling of the abdomen that is new, acute  Fever of 100F or higher  For urgent or emergent issues, a gastroenterologist can be reached at any hour by calling [phone]. Do not use MyChart messaging for urgent concerns.    DIET:  We do recommend a small meal at first, but then you may proceed to your regular diet.  Drink plenty of fluids but you should avoid alcoholic  beverages for 24 hours.  ACTIVITY:  You should plan to take it easy for the rest of today and you should NOT DRIVE or use heavy machinery until tomorrow (because of the sedation medicines used during the test).    FOLLOW UP: Our staff will call the number listed on your records the next business day following your procedure.  We will call around 7:15- 8:00 am to check on you and address any questions or concerns that you may have regarding the information given to you following your procedure. If we do not reach you, we will leave a message.     If any biopsies were taken you will be contacted by phone or by letter within the next 1-3 weeks.  Please call us at [phone] if you have not heard about the biopsies in 3 weeks.    SIGNATURES/CONFIDENTIALITY: You and/or your care partner have signed paperwork which will be entered into your electronic medical record.  These signatures attest to the fact that that the information above on your After Visit Summary has been reviewed and is understood.  Full responsibility of the confidentiality of this discharge information lies with you and/or your care-partner.

## 2023-08-11 ENCOUNTER — Telehealth: Payer: Self-pay

## 2023-08-11 NOTE — Telephone Encounter (Signed)
  Follow up Call-     08/10/2023   12:36 PM  Call back number  Post procedure Call Back phone  # [phone]  Permission to leave phone message Yes     Patient questions:  Do you have a fever, pain , or abdominal swelling? No. Pain Score  0 *  Have you tolerated food without any problems? Yes.    Have you been able to return to your normal activities? Yes.    Do you have any questions about your discharge instructions: Diet   No. Medications  No. Follow up visit  No.  Do you have questions or concerns about your Care? No.  Actions: * If pain score is 4 or above: No action needed, pain <4.

## 2023-08-12 ENCOUNTER — Telehealth: Payer: Self-pay | Admitting: Internal Medicine

## 2023-08-12 NOTE — Telephone Encounter (Signed)
 Inbound call from patient stating he would like a referral sent to urology. Patient states he currently does not have a pcp to send referral. Requesting a call back. Please advise, thank you.

## 2023-08-12 NOTE — Telephone Encounter (Signed)
 Pt states he was told he has an enlarged prostate at the time of his colon. Pt has no PCP. Asking to be referred to [facility] urology in [place]. Referral faxed to [phone].

## 2023-08-13 ENCOUNTER — Encounter: Payer: Self-pay | Admitting: Internal Medicine

## 2023-08-13 LAB — SURGICAL PATHOLOGY

## 2024-03-28 ENCOUNTER — Encounter: Payer: Self-pay | Admitting: Radiology
